# Patient Record
Sex: Male | Born: 1975 | Race: Black or African American | Hispanic: No | Marital: Single | Smoking: Current every day smoker
Health system: Southern US, Community
[De-identification: ages and names within clinical notes are randomized; demographics above are authoritative.]

## PROBLEM LIST (undated history)

## (undated) DIAGNOSIS — S8263XA Displaced fracture of lateral malleolus of unspecified fibula, initial encounter for closed fracture: Secondary | ICD-10-CM

## (undated) DIAGNOSIS — F1910 Other psychoactive substance abuse, uncomplicated: Secondary | ICD-10-CM

---
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
## 1998-05-14 ENCOUNTER — Emergency Department (HOSPITAL_COMMUNITY): Admission: EM | Admit: 1998-05-14 | Discharge: 1998-05-14 | Payer: Self-pay | Admitting: Emergency Medicine

## 1998-05-14 ENCOUNTER — Encounter: Payer: Self-pay | Admitting: Emergency Medicine

## 2000-01-31 ENCOUNTER — Emergency Department (HOSPITAL_COMMUNITY): Admission: EM | Admit: 2000-01-31 | Discharge: 2000-01-31 | Payer: Self-pay | Admitting: Emergency Medicine

## 2000-01-31 ENCOUNTER — Encounter: Payer: Self-pay | Admitting: Emergency Medicine

## 2004-08-26 ENCOUNTER — Emergency Department (HOSPITAL_COMMUNITY): Admission: EM | Admit: 2004-08-26 | Discharge: 2004-08-26 | Payer: Self-pay | Admitting: Emergency Medicine

## 2004-12-31 ENCOUNTER — Emergency Department (HOSPITAL_COMMUNITY): Admission: EM | Admit: 2004-12-31 | Discharge: 2004-12-31 | Payer: Self-pay | Admitting: Emergency Medicine

## 2006-08-26 ENCOUNTER — Emergency Department (HOSPITAL_COMMUNITY): Admission: EM | Admit: 2006-08-26 | Discharge: 2006-08-26 | Payer: Self-pay | Admitting: Emergency Medicine

## 2011-08-31 ENCOUNTER — Emergency Department (HOSPITAL_COMMUNITY): Payer: Self-pay

## 2011-08-31 ENCOUNTER — Emergency Department (HOSPITAL_COMMUNITY)
Admission: EM | Admit: 2011-08-31 | Discharge: 2011-08-31 | Disposition: A | Discharge status: Home / Self Care | Payer: Self-pay | Attending: Emergency Medicine | Admitting: Emergency Medicine

## 2011-08-31 ENCOUNTER — Encounter (HOSPITAL_COMMUNITY): Payer: Self-pay | Admitting: *Deleted

## 2011-08-31 DIAGNOSIS — F17200 Nicotine dependence, unspecified, uncomplicated: Secondary | ICD-10-CM | POA: Insufficient documentation

## 2011-08-31 DIAGNOSIS — M25519 Pain in unspecified shoulder: Secondary | ICD-10-CM | POA: Insufficient documentation

## 2011-08-31 DIAGNOSIS — M25512 Pain in left shoulder: Secondary | ICD-10-CM

## 2011-08-31 MED ORDER — DIAZEPAM 5 MG PO TABS: 5.0000 mg | ORAL_TABLET | Freq: Two times a day (BID) | ORAL | Status: AC | PRN

## 2011-08-31 MED ORDER — DIAZEPAM 5 MG PO TABS
5.0000 mg | ORAL_TABLET | Freq: Once | ORAL | Status: AC
  Administered 2011-08-31: 5 mg via ORAL
  Filled 2011-08-31: qty 1

## 2011-08-31 MED ORDER — HYDROCODONE-ACETAMINOPHEN 5-325 MG PO TABS: 1.0000 | ORAL_TABLET | Freq: Four times a day (QID) | ORAL | Status: AC | PRN

## 2011-08-31 MED ORDER — IBUPROFEN 400 MG PO TABS
800.0000 mg | ORAL_TABLET | Freq: Once | ORAL | Status: AC
  Administered 2011-08-31: 800 mg via ORAL
  Filled 2011-08-31 (×2): qty 1

## 2011-08-31 MED ORDER — IBUPROFEN 800 MG PO TABS: 800.0000 mg | ORAL_TABLET | Freq: Three times a day (TID) | ORAL | Status: AC

## 2011-08-31 NOTE — ED Provider Notes (Signed)
History    Scribed for [name], MD, the patient was seen in room TR02C/TR02C. This chart was scribed by Jared Morales.   CSN: [id number]  Arrival date & time 08/31/11  1119   First MD Initiated Contact with Patient 08/31/11 1329      Chief Complaint  Patient presents with  . Neck Pain  . Back Pain  . Shoulder Pain  . Hand Pain    (Consider location/radiation/quality/duration/timing/severity/associated sxs/prior treatment) HPI [name], MD entered patient's room at 2:14 PM   Jared Morales is a 36 y.o. male with history of chronic neck pain presents to the Emergency Department complaining of worsening of pain in constant neck and left shoulder pain that radiates to back and hands. Patient dislocated shoulder while playing football years ago.  Patient states he popped his shoulder back in and did not have xray following the event.   Symptoms are associated with numbness in 3 fingers (new) and minor headaches. Patient took Aleve this AM without relief.   Symptoms are not associated with chest pain, abdominal pain, incontinence, confusion, vomiting or fever.        History reviewed. No pertinent past medical history.  Past Surgical History  Procedure Date  . Knee surgery     No family history on file.  History  Substance Use Topics  . Smoking status: Current Everyday Smoker  . Smokeless tobacco: Not on file  . Alcohol Use: Yes      Review of Systems  All other systems reviewed and are negative.  Remaining review of systems negative except as noted in the HPI.    Allergies  Review of patient's allergies indicates no known allergies.  Home Medications   Current Outpatient Rx  Name Route Sig Dispense Refill  . ALEVE PO Oral Take 2 tablets by mouth once as needed. For pain    . DIAZEPAM 5 MG PO TABS Oral Take 1 tablet (5 mg total) by mouth every 12 (twelve) hours as needed (muscle spasm). 12 tablet 0  . HYDROCODONE-ACETAMINOPHEN 5-325 MG PO  TABS Oral Take 1 tablet by mouth every 6 (six) hours as needed for pain. 12 tablet 0  . IBUPROFEN 800 MG PO TABS Oral Take 1 tablet (800 mg total) by mouth 3 (three) times daily. 9 tablet 0    BP 122/75  Pulse 62  Temp 98.9 F (37.2 C) (Oral)  Resp 16  Ht 5\' 6"  (1.676 m)  Wt 150 lb (68.04 kg)  BMI 24.21 kg/m2  SpO2 100%  Physical Exam  Nursing note and vitals reviewed. Constitutional: He is oriented to person, place, and time. He appears well-developed. No distress.  HENT:  Head: Normocephalic and atraumatic.  Eyes: Conjunctivae and EOM are normal.  Cardiovascular: Normal rate and regular rhythm.   Pulmonary/Chest: Effort normal. No stridor. No respiratory distress.  Abdominal: He exhibits no distension.  Musculoskeletal: He exhibits tenderness. He exhibits no edema.       Superior trapezius pain, superior thoracic tenderness, diminished abduction, left AC joint tenderness   Neurological: He is alert and oriented to person, place, and time.  Skin: Skin is warm and dry.  Psychiatric: He has a normal mood and affect.    ED Course  Procedures (including critical care time)     COORDINATION OF CARE: 2:18 PM  Physical exam complete.  Will xray left shoulder.   2:30 PM  Ibuprofen and Valium ordered.   3:09 PM  Plan to discharge patient.  Patient agrees with  plan.     Orders Placed This Encounter  Procedures  . DG Shoulder Left  . Apply ice    LABS / RADIOLOGY:   Labs Reviewed - No data to display Dg Shoulder Left  08/31/2011  *RADIOLOGY REPORT*  Clinical Data: Left shoulder pain.  History of recent dislocation.  LEFT SHOULDER - 2+ VIEW  Comparison: None  Findings: The joint spaces are maintained.  No fracture or dislocation.  The [facility] joint is intact.  There is mild lateral downsloping of the acromion.  The left lung is clear.  IMPRESSION: No fracture or dislocation.  Original Report Authenticated By: P. Loralie Champagne, M.D.         MDM  This young M presents in no  distress w worsening of his chronic L UE pain, and dysesthesia.  Absent focal neuro findings, or e/o systemic pathology, with reassuring XR, demonstrating likely chronic findings, he was d/c w Ortho f/u.    MEDICATIONS GIVEN IN THE E.D. Scheduled Meds:    . diazepam  5 mg Oral Once  . ibuprofen  800 mg Oral Once   Continuous Infusions:      IMPRESSION: 1. Shoulder pain, left      NEW MEDICATIONS: New Prescriptions   DIAZEPAM (VALIUM) 5 MG TABLET    Take 1 tablet (5 mg total) by mouth every 12 (twelve) hours as needed (muscle spasm).   HYDROCODONE-ACETAMINOPHEN (NORCO/VICODIN) 5-325 MG PER TABLET    Take 1 tablet by mouth every 6 (six) hours as needed for pain.   IBUPROFEN (ADVIL,MOTRIN) 800 MG TABLET    Take 1 tablet (800 mg total) by mouth 3 (three) times daily.    I personally performed the services described in this documentation, which was scribed in my presence. The recorded information has been reviewed and considered.    [name], MD 09/01/11 [phone]

## 2011-08-31 NOTE — ED Notes (Signed)
Patient reports he has had severe neck pain that radiates into his left shoulder, neck and into his hand.  He reports over the past 2 weeks he has had onset of numbness in his hand and arm on the left side.  He has hx of dislocation to the left shoulder,  No other trauma

## 2013-12-04 DIAGNOSIS — S8263XA Displaced fracture of lateral malleolus of unspecified fibula, initial encounter for closed fracture: Secondary | ICD-10-CM

## 2013-12-04 HISTORY — DX: Displaced fracture of lateral malleolus of unspecified fibula, initial encounter for closed fracture: S82.63XA

## 2013-12-23 ENCOUNTER — Emergency Department (HOSPITAL_COMMUNITY)
Admission: EM | Admit: 2013-12-23 | Discharge: 2013-12-23 | Disposition: A | Discharge status: Home / Self Care | Payer: Self-pay | Attending: Emergency Medicine | Admitting: Emergency Medicine

## 2013-12-23 ENCOUNTER — Emergency Department (HOSPITAL_COMMUNITY): Payer: Self-pay

## 2013-12-23 ENCOUNTER — Encounter (HOSPITAL_COMMUNITY): Payer: Self-pay | Admitting: Emergency Medicine

## 2013-12-23 DIAGNOSIS — M25371 Other instability, right ankle: Secondary | ICD-10-CM

## 2013-12-23 DIAGNOSIS — Z791 Long term (current) use of non-steroidal anti-inflammatories (NSAID): Secondary | ICD-10-CM | POA: Insufficient documentation

## 2013-12-23 DIAGNOSIS — S99919A Unspecified injury of unspecified ankle, initial encounter: Secondary | ICD-10-CM

## 2013-12-23 DIAGNOSIS — S82891A Other fracture of right lower leg, initial encounter for closed fracture: Secondary | ICD-10-CM | POA: Insufficient documentation

## 2013-12-23 DIAGNOSIS — Y9389 Activity, other specified: Secondary | ICD-10-CM | POA: Insufficient documentation

## 2013-12-23 DIAGNOSIS — S0081XA Abrasion of other part of head, initial encounter: Secondary | ICD-10-CM | POA: Insufficient documentation

## 2013-12-23 DIAGNOSIS — T1490XA Injury, unspecified, initial encounter: Secondary | ICD-10-CM

## 2013-12-23 DIAGNOSIS — Y9289 Other specified places as the place of occurrence of the external cause: Secondary | ICD-10-CM | POA: Insufficient documentation

## 2013-12-23 DIAGNOSIS — Z720 Tobacco use: Secondary | ICD-10-CM | POA: Insufficient documentation

## 2013-12-23 DIAGNOSIS — Y998 Other external cause status: Secondary | ICD-10-CM | POA: Insufficient documentation

## 2013-12-23 DIAGNOSIS — S0012XA Contusion of left eyelid and periocular area, initial encounter: Secondary | ICD-10-CM

## 2013-12-23 MED ORDER — IBUPROFEN 800 MG PO TABS: 800.0000 mg | ORAL_TABLET | Freq: Three times a day (TID) | ORAL | Status: DC

## 2013-12-23 MED ORDER — HYDROCODONE-ACETAMINOPHEN 5-325 MG PO TABS
1.0000 | ORAL_TABLET | Freq: Once | ORAL | Status: AC
  Administered 2013-12-23: 1 via ORAL
  Filled 2013-12-23: qty 1

## 2013-12-23 MED ORDER — HYDROCODONE-ACETAMINOPHEN 5-325 MG PO TABS: 1.0000 | ORAL_TABLET | ORAL | Status: DC | PRN

## 2013-12-23 NOTE — Progress Notes (Signed)
Orthopedic Tech Progress Note Patient Details:  Jared SiaDerrick L Morales 03-17-1975 [id number]  Ortho Devices Type of Ortho Device: Ace wrap, Post (short leg) splint, Stirrup splint Ortho Device/Splint Location: rle Ortho Device/Splint Interventions: Application   Jared Morales 12/23/2013, 11:27 AM
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
## 2013-12-23 NOTE — Discharge Instructions (Signed)
Ankle Fracture °A fracture is a break in a bone. The ankle joint is made up of three bones. These include the lower (distal) sections of your lower leg bones, called the tibia and fibula, along with a bone in your foot, called the talus. Depending on how bad the break is and if more than one ankle joint bone is broken, a cast or splint is used to protect and keep your injured bone from moving while it heals. Sometimes, surgery is required to help the fracture heal properly.  °There are two general types of fractures: °· Stable fracture. This includes a single fracture line through one bone, with no injury to ankle ligaments. A fracture of the talus that does not have any displacement (movement of the bone on either side of the fracture line) is also stable. °· Unstable fracture. This includes more than one fracture line through one or more bones in the ankle joint. It also includes fractures that have displacement of the bone on either side of the fracture line. °CAUSES °· A direct blow to the ankle.   °· Quickly and severely twisting your ankle. °· Trauma, such as a car accident or falling from a significant height. °RISK FACTORS °You may be at a higher risk of ankle fracture if: °· You have certain medical conditions. °· You are involved in high-impact sports. °· You are involved in a high-impact car accident. °SIGNS AND SYMPTOMS  °· Tender and swollen ankle. °· Bruising around the injured ankle. °· Pain on movement of the ankle. °· Difficulty walking or putting weight on the ankle. °· A cold foot below the site of the ankle injury. This can occur if the blood vessels passing through your injured ankle were also damaged. °· Numbness in the foot below the site of the ankle injury. °DIAGNOSIS  °An ankle fracture is usually diagnosed with a physical exam and X-rays. A CT scan may also be required for complex fractures. °TREATMENT  °Stable fractures are treated with a cast or splint and using crutches to avoid putting  weight on your injured ankle. This is followed by an ankle strengthening program. Some patients require a special type of cast, depending on other medical problems they may have. Unstable fractures require surgery to ensure the bones heal properly. Your health care provider will tell you what type of fracture you have and the best treatment for your condition. °HOME CARE INSTRUCTIONS  °· Review correct crutch use with your health care provider and use your crutches as directed. Safe use of crutches is extremely important. Misuse of crutches can cause you to fall or cause injury to nerves in your hands or armpits. °· Do not put weight or pressure on the injured ankle until directed by your health care provider. °· To lessen the swelling, keep the injured leg elevated while sitting or lying down. °· Apply ice to the injured area: °¨ Put ice in a plastic bag. °¨ Place a towel between your cast and the bag. °¨ Leave the ice on for 20 minutes, 2-3 times a day. °· If you have a plaster or fiberglass cast: °¨ Do not try to scratch the skin under the cast with any objects. This can increase your risk of skin infection. °¨ Check the skin around the cast every day. You may put lotion on any red or sore areas. °¨ Keep your cast dry and clean. °· If you have a plaster splint: °¨ Wear the splint as directed. °¨ You may loosen the elastic   around the splint if your toes become numb, tingle, or turn cold or blue.  Do not put pressure on any part of your cast or splint; it may break. Rest your cast only on a pillow the first 24 hours until it is fully hardened.  Your cast or splint can be protected during bathing with a plastic bag sealed to your skin with medical tape. Do not lower the cast or splint into water.  Take medicines as directed by your health care provider. Only take over-the-counter or prescription medicines for pain, discomfort, or fever as directed by your health care provider.  Do not drive a vehicle until  your health care provider specifically tells you it is safe to do so.  If your health care provider has given you a follow-up appointment, it is very important to keep that appointment. Not keeping the appointment could result in a chronic or permanent injury, pain, and disability. If you have any problem keeping the appointment, call the facility for assistance. SEEK MEDICAL CARE IF: You develop increased swelling or discomfort. SEEK IMMEDIATE MEDICAL CARE IF:   Your cast gets damaged or breaks.  You have continued severe pain.  You develop new pain or swelling after the cast was put on.  Your skin or toenails below the injury turn blue or gray.  Your skin or toenails below the injury feel cold, numb, or have loss of sensitivity to touch.  There is a bad smell or pus draining from under the cast. MAKE SURE YOU:   Understand these instructions.  Will watch your condition.  Will get help right away if you are not doing well or get worse. Document Released: 01/18/2000 Document Revised: 01/25/2013 Document Reviewed: 08/19/2012 [facility] Patient Information 2015 [place], [place]. This information is not intended to replace advice given to you by your health care provider. Make sure you discuss any questions you have with your health care provider.   Emergency Department Resource Guide 1) Find a Doctor and Pay Out of Pocket Although you won't have to find out who is covered by your insurance plan, it is a good idea to ask around and get recommendations. You will then need to call the office and see if the doctor you have chosen will accept you as a new patient and what types of options they offer for patients who are self-pay. Some doctors offer discounts or will set up payment plans for their patients who do not have insurance, but you will need to ask so you aren't surprised when you get to your appointment.  2) Contact Your Local Health Department Not all health departments have doctors  that can see patients for sick visits, but many do, so it is worth a call to see if yours does. If you don't know where your local health department is, you can check in your phone book. The CDC also has a tool to help you locate your state's health department, and many state websites also have listings of all of their local health departments.  3) Find a Walk-in Clinic If your illness is not likely to be very severe or complicated, you may want to try a walk in clinic. These are popping up all over the country in pharmacies, drugstores, and shopping centers. They're usually staffed by nurse practitioners or physician assistants that have been trained to treat common illnesses and complaints. They're usually fairly quick and inexpensive. However, if you have serious medical issues or chronic medical problems, these are probably not  your best option.  No Primary Care Doctor: - Call Health Connect at  [phone] - they can help you locate a primary care doctor that  accepts your insurance, provides certain services, etc. - Physician Referral Service- [phone]  Chronic Pain Problems: Organization         Address  Phone   Notes  [facility]  [phone] Patients need to be referred by their primary care doctor.   Medication Assistance: Organization         Address  Phone   Notes  [facility] Medication [facility] [address] [place] [place], [place] [address] [phone] --Must be a resident of [facility] -- Must have NO insurance coverage whatsoever (no Medicaid/ Medicare, etc.) -- The pt. MUST have a primary care doctor that directs their care regularly and follows them in the community   [facility]  [phone]   [employer]  [phone]    Agencies that provide inexpensive medical care: Organization         Address  Phone   Notes  [facility]  [phone]   [facility]    [phone]   [facility] [address] [place], [place] [address] [phone]   [facility] [address], [place] [phone]   Planned Parenthood    [phone]   [facility]    [phone]   [facility]  [address], [place] Phone:  [phone], Fax:  [phone] Hours of Operation:  9 am - 6 pm, M-F.  Also accepts Medicaid/Medicare and self-pay.  [facility]  [address], [place] Phone: [phone], Fax: [phone]. Hours of Operation:  8:30 am - 5:30 pm, M-F.  Also accepts Medicaid and self-pay.  [facility] [place] [address] [place] Phone: [phone]   [facility] [address] Natasha Bence [place], [place] [phone], Ext. [phone] Mondays & Thursdays: 7-9 AM.  First 15 patients are seen on a first come, first serve basis.    Medicaid-accepting [facility] Providers:  Organization         Address  Phone   Notes  [facility] [address], [place] [phone] Also accepts self-pay patients.  [facility] [address] Laurell Josephs [place], [place]  [phone]   [facility] [address], [place] [phone]   [facility] [address] [place] [phone]   Renaye Rakers [address], [place]   [phone] Only accepts [place] Access [place] patients after they have their name applied to their card.   Self-Pay (no insurance) in [facility]:  Organization         Address  Phone   Notes  Sickle Cell Patients, [facility] [address] [place], [place] [phone]   [facility] [address] [place], [place] [phone]   [facility] [place]  [address], [place] [phone]   [facility]/Dr.  Osei-Bonsu  [address], [place] or [address], [place] [phone] Phone number for both [place] and [place] locations is the same.  [facility] [address]  Dr, [place] [phone]) [phone]   [facility] [address] [place] [address], [place] or [address] [phone]) [phone]   [facility] [address], [place] [phone](336) [phone], phone; [phone], fax Sees patients 1st and 3rd Saturday of every month.  Must not qualify for public or private insurance (i.e. Medicaid, Medicare, [place] Health Choice, Veterans' Benefits)  Household income should be no more than 200% of the poverty level The clinic cannot treat you if you are pregnant or think you are pregnant  Sexually transmitted diseases are not treated at the clinic.    Dental Care: Organization         Address  Phone  Notes  [facility] [address], [place] [phone] Accepts children up to age 40 who are enrolled in [place] or [place] Health Choice; pregnant women with a Medicaid card; and children who have applied for Medicaid or [place] Health Choice, but were declined, whose parents can pay a reduced fee at time of service.  [facility] [place]  [address], [place] [phone]) [phone] Accepts children up to age 40 who are enrolled in [place] or [place] Health Choice; pregnant women with a Medicaid card; and children who have applied for Medicaid or [place] Health Choice, but were declined, whose parents can pay a reduced fee at time of service.  [facility]  [address], [place] [phone]) [phone] Patients are seen by appointment only. Walk-ins are not accepted. [facility] will see patients 40 years of age and older. Monday - Tuesday (8am-5pm) Most Wednesdays (8:30-5pm) $30 per visit, cash only  [facility]  [address], [facility] [place] [phone]) [phone] Patients are seen by appointment only. Walk-ins are not accepted. [facility] will see patients 40 years of age and older. One Wednesday Evening (Monthly: Volunteer Based).  $30 per visit, cash only  [facility]  [phone] for adults; Children under age 524, call Graduate Pediatric Dentistry at [phone]) [phone]. Children aged 694-14, please call [phone] to request a pediatric application.  Dental services are provided in all areas of dental care including fillings, crowns and bridges, complete and partial dentures, implants, gum treatment, root canals, and extractions. Preventive care is also provided. Treatment is provided to both adults and children. Patients are selected via a lottery and there is often a waiting list.   [facility] [address], [place]  [phone] [email or web address]   [facility] [address] [place] [address], [place], [place] [phone], Ext. [phone] Second and Fourth Thursday of each month, opens at 6:30 AM; Clinic ends at 9 AM.  Patients are seen on a first-come first-served basis, and a limited number are seen during each clinic.   [facility]  [address], [place], [place] [phone]   Eligibility Requirements You must have lived in [place], [place], or [place] counties for at least the last three months.   You cannot be eligible for state or federal sponsored [place] Cityhealthcare insurance, including CIGNAVeterans Administration, [place]icaid, or [employer] EntertainmentMedicare.   You generally cannot be eligible for healthcare insurance through your employer.    How to apply: Eligibility screenings are held every Tuesday and Wednesday afternoon from 1:00 pm until 4:00 pm. You do not need an appointment for the interview!  [facility] [address],  [place], [place] [phone]   [facility] Health Department  [phone]   [facility]  Health Department  [phone]   [facility] Health Department  [phone]    Behavioral Health Resources in the Community: Intensive Outpatient Programs Organization         Address  Phone  Notes  [facility] [address] [place], [place] [phone]   [facility] [address], [place], [place] [phone]   [facility] [address], [place], [place]  [phone]   [facility] Mental Health [address]  [place], [place] [phone] or [phone]   Substance Abuse Resources Organization         Address  Phone  Notes  [facility] Drug Services  [phone]   [facility]  [phone]   [facility] [place]  [phone]   Floydene Flock  [phone]   Residential & Outpatient Substance Abuse Program  [phone]   Psychological Services Organization         Address  Phone  Notes  [facility] Behavior[facility] Health  [phone]   [facility] Services  [phone]   [facility] Mental Health [address], [place] [phone] or [phone]    Mobile Crisis Teams Organization         Address  Phone  Notes  [facility], Mobile Crisis Care Unit  [phone]   [facility]  [address] [place], [place] [phone]   Doristine Locks [address] [place] [phone]    Self-Help/Support Groups Organization         Address  Phone             Notes  [facility] [place] - variety of support groups  [phone]- I7437963 Call for more information  Narcotics Anonymous (NA), [facility] [address], [employer]   2 meetings at this location   Statistician         Address  Phone  Notes  [facility] [address],    [place]  [phone]   [facility]  [address] [place] [address], [place], [place] [phone]   [facility]  Facility [address] [place], [place] [phone] Admissions: 8am-3pm M-F  [facility] [address]    [place], [place] [phone]   [facility] [address] [place], [place], [place] [phone]   [facility] [address],  [place], [place] [phone]   [facility] - Intensive Outpatient [address] Laurell Josephs [address], [place], [place] [phone]   [facility] (Addiction Recovery [facility] [address]  [place], [place] [phone] or [phone]   [facility] ([facility]) [address], [place], [place] [phone] Accepts Medicaid  [facility] [address]  [place] [phone] Substance Abuse/Addiction Treatment   [facility] Organization         Address  Phone  Notes  [facility]  [phone]   Angie Fava, PhD [address] Ervin Knack [place], [place]   [phone] or [phone]   [facility] Behavioral   [address] [place], [place] [phone]   [facility] [address], [place], [place] [phone] Insurance/Medicaid/sponsorship through [employer] and Families [address]  [place], [place] [phone] [place], [place] [phone]    Dr. Adele Schilder  [phone]   [facility] Dept. 1) [address] [place] 2) [place] 3)  [address], [place] [phone]  [phone]   [place] [phone] or [phone] (After Hours)

## 2013-12-26 ENCOUNTER — Encounter (HOSPITAL_BASED_OUTPATIENT_CLINIC_OR_DEPARTMENT_OTHER): Payer: Self-pay | Admitting: *Deleted

## 2013-12-27 ENCOUNTER — Encounter (HOSPITAL_BASED_OUTPATIENT_CLINIC_OR_DEPARTMENT_OTHER)
Admission: RE | Disposition: A | Discharge status: Home / Self Care | Payer: Self-pay | Source: Ambulatory Visit | Attending: Orthopedic Surgery

## 2013-12-27 ENCOUNTER — Ambulatory Visit (HOSPITAL_BASED_OUTPATIENT_CLINIC_OR_DEPARTMENT_OTHER): Payer: Self-pay | Admitting: Certified Registered"

## 2013-12-27 ENCOUNTER — Encounter (HOSPITAL_BASED_OUTPATIENT_CLINIC_OR_DEPARTMENT_OTHER): Payer: Self-pay

## 2013-12-27 ENCOUNTER — Ambulatory Visit (HOSPITAL_BASED_OUTPATIENT_CLINIC_OR_DEPARTMENT_OTHER)
Admission: RE | Admit: 2013-12-27 | Discharge: 2013-12-27 | Disposition: A | Discharge status: Home / Self Care | Payer: Self-pay | Source: Ambulatory Visit | Attending: Orthopedic Surgery | Admitting: Orthopedic Surgery

## 2013-12-27 DIAGNOSIS — Z8781 Personal history of (healed) traumatic fracture: Secondary | ICD-10-CM | POA: Insufficient documentation

## 2013-12-27 DIAGNOSIS — F17200 Nicotine dependence, unspecified, uncomplicated: Secondary | ICD-10-CM | POA: Insufficient documentation

## 2013-12-27 DIAGNOSIS — Y999 Unspecified external cause status: Secondary | ICD-10-CM | POA: Insufficient documentation

## 2013-12-27 DIAGNOSIS — Y929 Unspecified place or not applicable: Secondary | ICD-10-CM | POA: Insufficient documentation

## 2013-12-27 DIAGNOSIS — Y939 Activity, unspecified: Secondary | ICD-10-CM | POA: Insufficient documentation

## 2013-12-27 DIAGNOSIS — Z791 Long term (current) use of non-steroidal anti-inflammatories (NSAID): Secondary | ICD-10-CM | POA: Insufficient documentation

## 2013-12-27 DIAGNOSIS — S8261XA Displaced fracture of lateral malleolus of right fibula, initial encounter for closed fracture: Secondary | ICD-10-CM | POA: Insufficient documentation

## 2013-12-27 DIAGNOSIS — X58XXXA Exposure to other specified factors, initial encounter: Secondary | ICD-10-CM | POA: Insufficient documentation

## 2013-12-27 HISTORY — PX: ORIF ANKLE FRACTURE: SHX5408

## 2013-12-27 HISTORY — DX: Displaced fracture of lateral malleolus of unspecified fibula, initial encounter for closed fracture: S82.63XA

## 2013-12-27 LAB — POCT HEMOGLOBIN-HEMACUE: Hemoglobin: 14.9 g/dL (ref 13.0–17.0)

## 2013-12-27 SURGERY — OPEN REDUCTION INTERNAL FIXATION (ORIF) ANKLE FRACTURE
Anesthesia: Regional | Site: Ankle | Laterality: Right

## 2013-12-27 MED ORDER — ACETAMINOPHEN 500 MG PO TABS
ORAL_TABLET | ORAL | Status: AC
  Filled 2013-12-27: qty 2

## 2013-12-27 MED ORDER — ROPIVACAINE HCL 5 MG/ML IJ SOLN
INTRAMUSCULAR | Status: DC | PRN
  Administered 2013-12-27: 30 mL via PERINEURAL

## 2013-12-27 MED ORDER — OXYCODONE HCL 5 MG PO TABS: 5.0000 mg | ORAL_TABLET | Freq: Once | ORAL | Status: DC | PRN

## 2013-12-27 MED ORDER — HYDROMORPHONE HCL 1 MG/ML IJ SOLN: 0.2500 mg | INTRAMUSCULAR | Status: DC | PRN

## 2013-12-27 MED ORDER — KETOROLAC TROMETHAMINE 30 MG/ML IJ SOLN: 15.0000 mg | Freq: Once | INTRAMUSCULAR | Status: DC | PRN

## 2013-12-27 MED ORDER — LIDOCAINE HCL (CARDIAC) 20 MG/ML IV SOLN
INTRAVENOUS | Status: DC | PRN
  Administered 2013-12-27: 80 mg via INTRAVENOUS

## 2013-12-27 MED ORDER — ONDANSETRON HCL 4 MG PO TABS: 4.0000 mg | ORAL_TABLET | Freq: Three times a day (TID) | ORAL | Status: AC | PRN

## 2013-12-27 MED ORDER — OXYCODONE-ACETAMINOPHEN 5-325 MG PO TABS
2.0000 | ORAL_TABLET | ORAL | Status: AC | PRN
Start: 2013-12-27 — End: ?

## 2013-12-27 MED ORDER — FENTANYL CITRATE 0.05 MG/ML IJ SOLN
50.0000 ug | INTRAMUSCULAR | Status: DC | PRN
  Administered 2013-12-27: 100 ug via INTRAVENOUS

## 2013-12-27 MED ORDER — MIDAZOLAM HCL 2 MG/2ML IJ SOLN
1.0000 mg | INTRAMUSCULAR | Status: DC | PRN
  Administered 2013-12-27: 2 mg via INTRAVENOUS

## 2013-12-27 MED ORDER — ACETAMINOPHEN 500 MG PO TABS
1000.0000 mg | ORAL_TABLET | Freq: Once | ORAL | Status: AC
  Administered 2013-12-27: 1000 mg via ORAL

## 2013-12-27 MED ORDER — FENTANYL CITRATE 0.05 MG/ML IJ SOLN
INTRAMUSCULAR | Status: AC
  Filled 2013-12-27: qty 2

## 2013-12-27 MED ORDER — CEFAZOLIN SODIUM-DEXTROSE 2-3 GM-% IV SOLR
2.0000 g | INTRAVENOUS | Status: AC
  Administered 2013-12-27: 2 g via INTRAVENOUS

## 2013-12-27 MED ORDER — MIDAZOLAM HCL 2 MG/2ML IJ SOLN
INTRAMUSCULAR | Status: AC
  Filled 2013-12-27: qty 2

## 2013-12-27 MED ORDER — CEFAZOLIN SODIUM-DEXTROSE 2-3 GM-% IV SOLR
INTRAVENOUS | Status: AC
  Filled 2013-12-27: qty 50

## 2013-12-27 MED ORDER — ASPIRIN 81 MG PO TABS: 81.0000 mg | ORAL_TABLET | Freq: Every day | ORAL | Status: AC

## 2013-12-27 MED ORDER — GLYCOPYRROLATE 0.2 MG/ML IJ SOLN
INTRAMUSCULAR | Status: DC | PRN
  Administered 2013-12-27: 0.2 mg via INTRAVENOUS

## 2013-12-27 MED ORDER — LACTATED RINGERS IV SOLN
INTRAVENOUS | Status: DC
  Administered 2013-12-27 (×2): via INTRAVENOUS

## 2013-12-27 MED ORDER — DEXTROSE-NACL 5-0.45 % IV SOLN: 100.0000 mL/h | INTRAVENOUS | Status: DC

## 2013-12-27 MED ORDER — FENTANYL CITRATE 0.05 MG/ML IJ SOLN
INTRAMUSCULAR | Status: AC
  Filled 2013-12-27: qty 6

## 2013-12-27 MED ORDER — MIDAZOLAM HCL 2 MG/ML PO SYRP: 12.0000 mg | ORAL_SOLUTION | Freq: Once | ORAL | Status: DC | PRN

## 2013-12-27 MED ORDER — DEXAMETHASONE SODIUM PHOSPHATE 10 MG/ML IJ SOLN
INTRAMUSCULAR | Status: DC | PRN
  Administered 2013-12-27: 10 mg via INTRAVENOUS

## 2013-12-27 MED ORDER — PROPOFOL 10 MG/ML IV EMUL
INTRAVENOUS | Status: AC
  Filled 2013-12-27: qty 50

## 2013-12-27 MED ORDER — ONDANSETRON HCL 4 MG/2ML IJ SOLN
INTRAMUSCULAR | Status: DC | PRN
  Administered 2013-12-27: 4 mg via INTRAVENOUS

## 2013-12-27 MED ORDER — PROPOFOL 10 MG/ML IV BOLUS
INTRAVENOUS | Status: DC | PRN
  Administered 2013-12-27: 200 mg via INTRAVENOUS

## 2013-12-27 MED ORDER — OXYCODONE HCL 5 MG/5ML PO SOLN: 5.0000 mg | Freq: Once | ORAL | Status: DC | PRN

## 2013-12-27 MED ORDER — DOCUSATE SODIUM 100 MG PO CAPS: 100.0000 mg | ORAL_CAPSULE | Freq: Two times a day (BID) | ORAL | Status: AC

## 2013-12-27 SURGICAL SUPPLY — 72 items
BANDAGE ELASTIC 4 VELCRO ST LF (GAUZE/BANDAGES/DRESSINGS) ×3 IMPLANT
BANDAGE ELASTIC 6 VELCRO ST LF (GAUZE/BANDAGES/DRESSINGS) ×3 IMPLANT
BANDAGE ESMARK 6X9 LF (GAUZE/BANDAGES/DRESSINGS) ×1 IMPLANT
BIT DRILL 2.5X125 (BIT) ×2 IMPLANT
BIT DRILL 3.5X125 (BIT) IMPLANT
BLADE SURG 15 STRL LF DISP TIS (BLADE) ×2 IMPLANT
BLADE SURG 15 STRL SS (BLADE) ×6
BNDG CMPR 9X6 STRL LF SNTH (GAUZE/BANDAGES/DRESSINGS) ×1
BNDG COHESIVE 4X5 TAN STRL (GAUZE/BANDAGES/DRESSINGS) IMPLANT
BNDG ESMARK 6X9 LF (GAUZE/BANDAGES/DRESSINGS) ×3
CHLORAPREP W/TINT 26ML (MISCELLANEOUS) ×3 IMPLANT
CLOSURE STERI-STRIP 1/2X4 (GAUZE/BANDAGES/DRESSINGS) ×2
CLSR STERI-STRIP ANTIMIC 1/2X4 (GAUZE/BANDAGES/DRESSINGS) ×3 IMPLANT
COVER BACK TABLE 60X90IN (DRAPES) ×3 IMPLANT
COVER MAYO STAND STRL (DRAPES) ×3 IMPLANT
CUFF TOURNIQUET SINGLE 24IN (TOURNIQUET CUFF) IMPLANT
CUFF TOURNIQUET SINGLE 34IN LL (TOURNIQUET CUFF) ×2 IMPLANT
DECANTER SPIKE VIAL GLASS SM (MISCELLANEOUS) IMPLANT
DRAPE C-ARM 42X72 X-RAY (DRAPES) IMPLANT
DRAPE C-ARMOR (DRAPES) IMPLANT
DRAPE EXTREMITY T 121X128X90 (DRAPE) ×3 IMPLANT
DRAPE OEC MINIVIEW 54X84 (DRAPES) ×3 IMPLANT
DRAPE U 20/CS (DRAPES) ×3 IMPLANT
DRAPE U-SHAPE 47X51 STRL (DRAPES) ×3 IMPLANT
DRILL BIT 3.5X125 (BIT) ×3
DRSG EMULSION OIL 3X3 NADH (GAUZE/BANDAGES/DRESSINGS) ×3 IMPLANT
ELECT REM PT RETURN 9FT ADLT (ELECTROSURGICAL) ×3
ELECTRODE REM PT RTRN 9FT ADLT (ELECTROSURGICAL) ×1 IMPLANT
GAUZE SPONGE 4X4 12PLY STRL (GAUZE/BANDAGES/DRESSINGS) ×3 IMPLANT
GLOVE BIO SURGEON STRL SZ7.5 (GLOVE) ×5 IMPLANT
GLOVE BIO SURGEON STRL SZ8 (GLOVE) ×2 IMPLANT
GLOVE BIOGEL PI IND STRL 7.0 (GLOVE) IMPLANT
GLOVE BIOGEL PI IND STRL 8 (GLOVE) ×1 IMPLANT
GLOVE BIOGEL PI INDICATOR 7.0 (GLOVE) ×2
GLOVE BIOGEL PI INDICATOR 8 (GLOVE) ×2
GLOVE ECLIPSE 6.5 STRL STRAW (GLOVE) ×2 IMPLANT
GLOVE EXAM NITRILE MD LF STRL (GLOVE) ×2 IMPLANT
GOWN STRL REUS W/ TWL LRG LVL3 (GOWN DISPOSABLE) ×4 IMPLANT
GOWN STRL REUS W/ TWL XL LVL3 (GOWN DISPOSABLE) ×1 IMPLANT
GOWN STRL REUS W/TWL LRG LVL3 (GOWN DISPOSABLE) ×12
GOWN STRL REUS W/TWL XL LVL3 (GOWN DISPOSABLE) ×3
NEEDLE HYPO 22GX1.5 SAFETY (NEEDLE) IMPLANT
NS IRRIG 1000ML POUR BTL (IV SOLUTION) ×3 IMPLANT
PACK BASIN DAY SURGERY FS (CUSTOM PROCEDURE TRAY) ×3 IMPLANT
PAD CAST 4YDX4 CTTN HI CHSV (CAST SUPPLIES) ×1 IMPLANT
PADDING CAST COTTON 4X4 STRL (CAST SUPPLIES) ×3
PADDING CAST COTTON 6X4 STRL (CAST SUPPLIES) ×3 IMPLANT
PENCIL BUTTON HOLSTER BLD 10FT (ELECTRODE) ×3 IMPLANT
PLATE TUBUAL 1/3 6H (Plate) ×2 IMPLANT
SCREW CANC FT 16X4X2.5XHEX (Screw) IMPLANT
SCREW CANCELLOUS 4.0X14 (Screw) ×2 IMPLANT
SCREW CANCELLOUS 4.0X16MM (Screw) ×3 IMPLANT
SCREW CORTEX ST MATTA 3.5X12MM (Screw) ×4 IMPLANT
SCREW CORTEX ST MATTA 3.5X16MM (Screw) ×2 IMPLANT
SCREW CORTEX ST MATTA 3.5X20 (Screw) ×2 IMPLANT
SLEEVE SCD COMPRESS KNEE MED (MISCELLANEOUS) IMPLANT
SPLINT FAST PLASTER 5X30 (CAST SUPPLIES) ×20
SPLINT PLASTER CAST FAST 5X30 (CAST SUPPLIES) IMPLANT
SPONGE LAP 4X18 X RAY DECT (DISPOSABLE) ×3 IMPLANT
SUCTION FRAZIER TIP 10 FR DISP (SUCTIONS) IMPLANT
SUT MON AB 2-0 CT1 36 (SUTURE) ×3 IMPLANT
SUT MON AB 4-0 PC3 18 (SUTURE) ×3 IMPLANT
SUT VIC AB 0 SH 27 (SUTURE) IMPLANT
SUT VIC AB 2-0 SH 27 (SUTURE) ×3
SUT VIC AB 2-0 SH 27XBRD (SUTURE) IMPLANT
SYR 20CC LL (SYRINGE) ×3 IMPLANT
SYR BULB 3OZ (MISCELLANEOUS) ×3 IMPLANT
TOWEL OR 17X24 6PK STRL BLUE (TOWEL DISPOSABLE) ×6 IMPLANT
TOWEL OR NON WOVEN STRL DISP B (DISPOSABLE) ×3 IMPLANT
TUBE CONNECTING 20'X1/4 (TUBING) ×1
TUBE CONNECTING 20X1/4 (TUBING) ×2 IMPLANT
UNDERPAD 30X30 INCONTINENT (UNDERPADS AND DIAPERS) ×3 IMPLANT

## 2013-12-27 NOTE — Anesthesia Postprocedure Evaluation (Signed)
  Anesthesia Post-op Note  Patient: Jared Morales  Procedure(s) Performed: Procedure(s): OPEN REDUCTION INTERNAL FIXATION (ORIF) RIGHT LATERAL MALLEOLUS  (Right)  Patient Location: PACU  Anesthesia Type:General and Regional  Level of Consciousness: awake  Airway and Oxygen Therapy: Patient Spontanous Breathing  Post-op Pain: none  Post-op Assessment: Post-op Vital signs reviewed, Patient's Cardiovascular Status Stable, Respiratory Function Stable, Patent Airway, No signs of Nausea or vomiting and Pain level controlled  Post-op Vital Signs: Reviewed and stable  Last Vitals:  Filed Vitals:   12/27/13 1051  BP: 130/75  Pulse: 61  Temp: 36.5 C  Resp: 16    Complications: No apparent anesthesia complications

## 2013-12-27 NOTE — Discharge Instructions (Signed)
Elevate your Right leg  Do not put any weight on your right leg   Post Anesthesia Home Care Instructions  Activity: Get plenty of rest for the remainder of the day. A responsible adult should stay with you for 24 hours following the procedure.  For the next 24 hours, DO NOT: -Drive a car -Advertising copywriterperate machinery -Drink alcoholic beverages -Take any medication unless instructed by your physician -Make any legal decisions or sign important papers.  Meals: Start with liquid foods such as gelatin or soup. Progress to regular foods as tolerated. Avoid greasy, spicy, heavy foods. If nausea and/or vomiting occur, drink only clear liquids until the nausea and/or vomiting subsides. Call your physician if vomiting continues.  Special Instructions/Symptoms: Your throat may feel dry or sore from the anesthesia or the breathing tube placed in your throat during surgery. If this causes discomfort, gargle with warm salt water. The discomfort should disappear within 24 hours.   Regional Anesthesia Blocks  1. Numbness or the inability to move the "blocked" extremity may last from 3-48 hours after placement. The length of time depends on the medication injected and your individual response to the medication. If the numbness is not going away after 48 hours, call your surgeon.  2. The extremity that is blocked will need to be protected until the numbness is gone and the  Strength has returned. Because you cannot feel it, you will need to take extra care to avoid injury. Because it may be weak, you may have difficulty moving it or using it. You may not know what position it is in without looking at it while the block is in effect.  3. For blocks in the legs and feet, returning to weight bearing and walking needs to be done carefully. You will need to wait until the numbness is entirely gone and the strength has returned. You should be able to move your leg and foot normally before you try and bear weight or walk.  You will need someone to be with you when you first try to ensure you do not fall and possibly risk injury.  4. Bruising and tenderness at the needle site are common side effects and will resolve in a few days.  5. Persistent numbness or new problems with movement should be communicated to the surgeon or the [facility] [phone])/ [facility] [phone]).

## 2013-12-27 NOTE — Transfer of Care (Signed)
Immediate Anesthesia Transfer of Care Note  Patient: Jared Morales  Procedure(s) Performed: Procedure(s): OPEN REDUCTION INTERNAL FIXATION (ORIF) RIGHT LATERAL MALLEOLUS  (Right)  Patient Location: PACU  Anesthesia Type:GA combined with regional for post-op pain  Level of Consciousness: awake, alert  and oriented  Airway & Oxygen Therapy: Patient Spontanous Breathing and Patient connected to face mask oxygen  Post-op Assessment: Report given to PACU RN, Post -op Vital signs reviewed and stable and Patient moving all extremities  Post vital signs: Reviewed and stable  Complications: No apparent anesthesia complications

## 2013-12-27 NOTE — Anesthesia Procedure Notes (Addendum)
Anesthesia Regional Block:  Popliteal block  Pre-Anesthetic Checklist: ,, timeout performed, Correct Patient, Correct Site, Correct Laterality, Correct Procedure, Correct Position, site marked, Risks and benefits discussed,  Surgical consent,  Pre-op evaluation,  At surgeon's request and post-op pain management  Laterality: Lower and Right  Prep: chloraprep       Needles:  Injection technique: Single-shot  Needle Type: Echogenic Stimulator Needle          Additional Needles:  Procedures: ultrasound guided (picture in chart) and nerve stimulator Popliteal block  Nerve Stimulator or Paresthesia:  Response: plantarflexion, 0.5 mA,   Additional Responses:   Narrative:  Injection made incrementally with aspirations every 5 mL.  Performed by: Personally   Additional Notes: H+P and labs reviewed, risks and benefits discussed with patient, procedure tolerated well without complications   Procedure Name: LMA Insertion Date/Time: 12/27/2013 9:02 AM Performed by: Curly ShoresRAFT, Annessa Satre W Pre-anesthesia Checklist: Patient identified, Emergency Drugs available, Suction available and Patient being monitored Patient Re-evaluated:Patient Re-evaluated prior to inductionOxygen Delivery Method: Circle System Utilized Preoxygenation: Pre-oxygenation with 100% oxygen Intubation Type: IV induction Ventilation: Mask ventilation without difficulty LMA: LMA inserted LMA Size: 5.0 Number of attempts: 1 Airway Equipment and Method: bite block Placement Confirmation: positive ETCO2 Tube secured with: Tape Dental Injury: Teeth and Oropharynx as per pre-operative assessment

## 2013-12-27 NOTE — Op Note (Signed)
12/27/2013  9:33 AM  PATIENT:  Jared Morales    PRE-OPERATIVE DIAGNOSIS:  DISPLACED FRACTURE OF LATERAL MALLEOLUS OF RIGHT FIBULA   POST-OPERATIVE DIAGNOSIS:  Same  PROCEDURE:  OPEN REDUCTION INTERNAL FIXATION (ORIF) RIGHT LATERAL MALLEOLUS   SURGEON:  Quisha Mabie, D, MD  ASSISTANT: Janace LittenBrandon Parry, OPA, He was necessary for efficiency and safety of the case.   ANESTHESIA:   gen plus block  PREOPERATIVE INDICATIONS:  Jared Morales is a  38 y.o. male with a diagnosis of DISPLACED FRACTURE OF LATERAL MALLEOLUS OF RIGHT FIBULA  who failed conservative measures and elected for surgical management.    The risks benefits and alternatives were discussed with the patient preoperatively including but not limited to the risks of infection, bleeding, nerve injury, cardiopulmonary complications, the need for revision surgery, among others, and the patient was willing to proceed.  OPERATIVE IMPLANTS: 1/3 tubular plate and a lag screw  OPERATIVE FINDINGS: Unstable ankle fracture. Stable syndesmosis post op  BLOOD LOSS: min  COMPLICATIONS: none  TOURNIQUET TIME: 40min  OPERATIVE PROCEDURE:  Patient was identified in the preoperative holding area and site was marked by me He was transported to the operating theater and placed on the table in supine position taking care to pad all bony prominences. After a preincinduction time out anesthesia was induced. The right lower extremity was prepped and draped in normal sterile fashion and a pre-incision timeout was performed. Jared Morales received ancef for preoperative antibiotics.   I made a lateral incision of roughly 7 cm dissection was carried down sharply to the distal fibula and then spreading dissection was used proximally to protect the superficial peroneal nerve. I sharply incised the periosteum and took care to protect the peroneal tendons. I then debrided the fracture site and performed a reduction maneuver which was held in  place with a clamp.   I placed a lag screw across the fracture  I then selected a 6-hole one third tubular plate and placed in a neutralization fashion care was taken distally so as not to penetrate the joint with the cancellus screws.  I then stressed the syndesmosis and it was stable  The wound was then thoroughly irrigated and closed using a 0 Vicryl and absorbable Monocryl sutures. He was placed in a short leg splint.   POST OPERATIVE PLAN: Non-weightbearing. DVT prophylaxis will consist of mobility and a ASA 81 daily  This note was generated using a template and dragon dictation system. In light of that, I have reviewed the note and all aspects of it are applicable to this case. Any dictation errors are due to the computerized dictation system.

## 2013-12-27 NOTE — H&P (Signed)
      ORTHOPAEDIC CONSULTATION  REQUESTING PHYSICIAN: Renette Butters, MD  Chief Complaint: R ankle fracture  HPI: VIREN LEBEAU is a 38 y.o. male who complains of  Mechanical fall  Past Medical History  Diagnosis Date  . Fractured lateral malleolus 12/2013    right   Past Surgical History  Procedure Laterality Date  . Knee arthroscopy Left 1985   History   Social History  . Marital Status: Single    Spouse Name: N/A    Number of Children: N/A  . Years of Education: N/A   Social History Main Topics  . Smoking status: Current Every Day Smoker -- 0.50 packs/day for 15 years    Types: Cigarettes  . Smokeless tobacco: Never Used  . Alcohol Use: No  . Drug Use: No  . Sexual Activity: None   Other Topics Concern  . None   Social History Narrative   History reviewed. No pertinent family history. No Known Allergies Prior to Admission medications   Medication Sig Start Date End Date Taking? Authorizing Provider  HYDROcodone-acetaminophen (NORCO/VICODIN) 5-325 MG per tablet Take 1 tablet by mouth every 4 (four) hours as needed for moderate pain or severe pain. 12/23/13  Yes Courtney A Forcucci, PA-C  ibuprofen (ADVIL,MOTRIN) 800 MG tablet Take 1 tablet (800 mg total) by mouth 3 (three) times daily. 12/23/13  Yes Courtney A Forcucci, PA-C   No results found.  Positive ROS: All other systems have been reviewed and were otherwise negative with the exception of those mentioned in the HPI and as above.  Labs cbc No results for input(s): WBC, HGB, HCT, PLT in the last 72 hours.  Labs inflam No results for input(s): CRP in the last 72 hours.  Invalid input(s): ESR  Labs coag No results for input(s): INR, PTT in the last 72 hours.  Invalid input(s): PT  No results for input(s): NA, K, CL, CO2, GLUCOSE, BUN, CREATININE, CALCIUM in the last 72 hours.  Physical Exam: There were no vitals filed for this visit. General: Alert, no acute distress Cardiovascular: No  pedal edema Respiratory: No cyanosis, no use of accessory musculature GI: No organomegaly, abdomen is soft and non-tender Skin: No lesions in the area of chief complaint other than those listed below in MSK exam.  Neurologic: Sensation intact distally Psychiatric: Patient is competent for consent with normal mood and affect Lymphatic: No axillary or cervical lymphadenopathy  MUSCULOSKELETAL:  RLE: pain at the lat mal, mod swelliing, no blister, NVI Other extremities are atraumatic with painless ROM and NVI.  Assessment: R lat mal possible syndesmosis  Plan: ORIF    Edmonia Lynch, D, MD Cell [phone]   12/27/2013 7:43 AM

## 2013-12-27 NOTE — Anesthesia Preprocedure Evaluation (Signed)
Anesthesia Evaluation  Patient identified by MRN, date of birth, ID band  Reviewed: Allergy & Precautions, H&P , NPO status , Patient's Chart, lab work & pertinent test results  History of Anesthesia Complications Negative for: history of anesthetic complications  Airway Mallampati: II  TM Distance: >3 FB Neck ROM: Full    Dental  (+) Teeth Intact   Pulmonary neg sleep apnea, neg COPDneg recent URI, Current Smoker,  breath sounds clear to auscultation        Cardiovascular negative cardio ROS  Rhythm:Regular     Neuro/Psych negative neurological ROS  negative psych ROS   GI/Hepatic negative GI ROS, Neg liver ROS,   Endo/Other  negative endocrine ROS  Renal/GU negative Renal ROS     Musculoskeletal Right ankle fracture   Abdominal   Peds  Hematology negative hematology ROS (+)   Anesthesia Other Findings   Reproductive/Obstetrics                             Anesthesia Physical Anesthesia Plan  ASA: II  Anesthesia Plan: General and Regional   Post-op Pain Management:    Induction: Intravenous  Airway Management Planned: LMA  Additional Equipment: None  Intra-op Plan:   Post-operative Plan: Extubation in OR  Informed Consent: I have reviewed the patients History and Physical, chart, labs and discussed the procedure including the risks, benefits and alternatives for the proposed anesthesia with the patient or authorized representative who has indicated his/her understanding and acceptance.   Dental advisory given  Plan Discussed with: CRNA and Surgeon  Anesthesia Plan Comments:         Anesthesia Quick Evaluation

## 2013-12-27 NOTE — Progress Notes (Signed)
Assisted Dr. Moser with right, ultrasound guided, popliteal block. Side rails up, monitors on throughout procedure. See vital signs in flow sheet. Tolerated Procedure well. 

## 2014-01-02 ENCOUNTER — Encounter (HOSPITAL_BASED_OUTPATIENT_CLINIC_OR_DEPARTMENT_OTHER): Payer: Self-pay | Admitting: Orthopedic Surgery

## 2014-05-15 ENCOUNTER — Emergency Department (HOSPITAL_COMMUNITY)
Admission: EM | Admit: 2014-05-15 | Discharge: 2014-05-15 | Disposition: A | Discharge status: Home / Self Care | Payer: Self-pay | Attending: Emergency Medicine | Admitting: Emergency Medicine

## 2014-05-15 ENCOUNTER — Emergency Department (HOSPITAL_COMMUNITY): Payer: Self-pay

## 2014-05-15 ENCOUNTER — Encounter (HOSPITAL_COMMUNITY): Payer: Self-pay

## 2014-05-15 DIAGNOSIS — Y998 Other external cause status: Secondary | ICD-10-CM | POA: Insufficient documentation

## 2014-05-15 DIAGNOSIS — Z8781 Personal history of (healed) traumatic fracture: Secondary | ICD-10-CM | POA: Insufficient documentation

## 2014-05-15 DIAGNOSIS — S80862A Insect bite (nonvenomous), left lower leg, initial encounter: Secondary | ICD-10-CM | POA: Insufficient documentation

## 2014-05-15 DIAGNOSIS — S40862A Insect bite (nonvenomous) of left upper arm, initial encounter: Secondary | ICD-10-CM | POA: Insufficient documentation

## 2014-05-15 DIAGNOSIS — Y9389 Activity, other specified: Secondary | ICD-10-CM | POA: Insufficient documentation

## 2014-05-15 DIAGNOSIS — Y9289 Other specified places as the place of occurrence of the external cause: Secondary | ICD-10-CM | POA: Insufficient documentation

## 2014-05-15 DIAGNOSIS — W57XXXA Bitten or stung by nonvenomous insect and other nonvenomous arthropods, initial encounter: Secondary | ICD-10-CM | POA: Insufficient documentation

## 2014-05-15 DIAGNOSIS — Z79899 Other long term (current) drug therapy: Secondary | ICD-10-CM | POA: Insufficient documentation

## 2014-05-15 DIAGNOSIS — S40861A Insect bite (nonvenomous) of right upper arm, initial encounter: Secondary | ICD-10-CM | POA: Insufficient documentation

## 2014-05-15 DIAGNOSIS — S80861A Insect bite (nonvenomous), right lower leg, initial encounter: Secondary | ICD-10-CM | POA: Insufficient documentation

## 2014-05-15 DIAGNOSIS — Z720 Tobacco use: Secondary | ICD-10-CM | POA: Insufficient documentation

## 2014-05-15 DIAGNOSIS — Z7982 Long term (current) use of aspirin: Secondary | ICD-10-CM | POA: Insufficient documentation

## 2014-05-15 DIAGNOSIS — B349 Viral infection, unspecified: Secondary | ICD-10-CM | POA: Insufficient documentation

## 2014-05-15 DIAGNOSIS — R509 Fever, unspecified: Secondary | ICD-10-CM

## 2014-05-15 LAB — CBC WITH DIFFERENTIAL/PLATELET
Basophils Absolute: 0 10*3/uL (ref 0.0–0.1)
Basophils Relative: 0 % (ref 0–1)
Eosinophils Absolute: 0 10*3/uL (ref 0.0–0.7)
Eosinophils Relative: 0 % (ref 0–5)
HCT: 30.7 % — ABNORMAL LOW (ref 39.0–52.0)
HEMOGLOBIN: 10.4 g/dL — AB (ref 13.0–17.0)
LYMPHS ABS: 1 10*3/uL (ref 0.7–4.0)
LYMPHS PCT: 10 % — AB (ref 12–46)
MCH: 30.6 pg (ref 26.0–34.0)
MCHC: 33.9 g/dL (ref 30.0–36.0)
MCV: 90.3 fL (ref 78.0–100.0)
Monocytes Absolute: 0.9 10*3/uL (ref 0.1–1.0)
Monocytes Relative: 10 % (ref 3–12)
Neutro Abs: 7.3 10*3/uL (ref 1.7–7.7)
Neutrophils Relative %: 80 % — ABNORMAL HIGH (ref 43–77)
PLATELETS: 416 10*3/uL — AB (ref 150–400)
RBC: 3.4 MIL/uL — ABNORMAL LOW (ref 4.22–5.81)
RDW: 12.6 % (ref 11.5–15.5)
WBC: 9.2 10*3/uL (ref 4.0–10.5)

## 2014-05-15 LAB — COMPREHENSIVE METABOLIC PANEL
ALBUMIN: 2.5 g/dL — AB (ref 3.5–5.2)
ALT: 35 U/L (ref 0–53)
AST: 46 U/L — AB (ref 0–37)
Alkaline Phosphatase: 54 U/L (ref 39–117)
Anion gap: 9 (ref 5–15)
BILIRUBIN TOTAL: 0.6 mg/dL (ref 0.3–1.2)
BUN: 7 mg/dL (ref 6–23)
CALCIUM: 8.3 mg/dL — AB (ref 8.4–10.5)
CO2: 28 mmol/L (ref 19–32)
CREATININE: 1.14 mg/dL (ref 0.50–1.35)
Chloride: 95 mmol/L — ABNORMAL LOW (ref 96–112)
GFR calc Af Amer: >90 mL/min (ref >90)
GFR calc non Af Amer: 79 mL/min — ABNORMAL LOW (ref >90)
Glucose, Bld: 106 mg/dL — ABNORMAL HIGH (ref 70–99)
Potassium: 3.5 mmol/L (ref 3.5–5.1)
Sodium: 132 mmol/L — ABNORMAL LOW (ref 135–145)
TOTAL PROTEIN: 6.3 g/dL (ref 6.0–8.3)

## 2014-05-15 LAB — RAPID STREP SCREEN (MED CTR MEBANE ONLY): STREPTOCOCCUS, GROUP A SCREEN (DIRECT): NEGATIVE

## 2014-05-15 MED ORDER — ACETAMINOPHEN 325 MG PO TABS
650.0000 mg | ORAL_TABLET | Freq: Four times a day (QID) | ORAL | Status: DC | PRN
  Administered 2014-05-15: 650 mg via ORAL
  Filled 2014-05-15: qty 2

## 2014-05-15 MED ORDER — IBUPROFEN 600 MG PO TABS: ORAL_TABLET | ORAL | Status: AC

## 2014-05-15 MED ORDER — BENZONATATE 100 MG PO CAPS: 100.0000 mg | ORAL_CAPSULE | Freq: Three times a day (TID) | ORAL | Status: AC

## 2014-05-15 NOTE — ED Provider Notes (Signed)
CSN: [id number]     Arrival date & time 05/15/14  1215 History   First MD Initiated Contact with Patient 05/15/14 1448     Chief Complaint  Patient presents with  . Generalized Body Aches  . Rash     (Consider location/radiation/quality/duration/timing/severity/associated sxs/prior Treatment) Patient is a 39 y.o. male presenting with rash. The history is provided by the patient. No language interpreter was used.  Rash Location:  Leg and shoulder/arm Shoulder/arm rash location:  L arm and R arm Leg rash location:  L leg and R leg Quality: redness   Severity:  Moderate Onset quality:  Gradual Duration:  3 days Timing:  Constant Progression:  Worsening Chronicity:  New Relieved by:  Nothing Worsened by:  Nothing tried Ineffective treatments:  None tried Associated symptoms: fever and myalgias     Past Medical History  Diagnosis Date  . Fractured lateral malleolus 12/2013    right   Past Surgical History  Procedure Laterality Date  . Knee arthroscopy Left 1985  . Orif ankle fracture Right 12/27/2013    Procedure: OPEN REDUCTION INTERNAL FIXATION (ORIF) RIGHT LATERAL MALLEOLUS ;  Surgeon: Sheral Apley, MD;  [facility];  Service: Orthopedics;  Laterality: Right;   No family history on file. History  Substance Use Topics  . Smoking status: Current Every Day Smoker -- 0.50 packs/day for 15 years    Types: Cigarettes  . Smokeless tobacco: Never Used  . Alcohol Use: No    Review of Systems  Constitutional: Positive for fever.  Musculoskeletal: Positive for myalgias.  Skin: Positive for rash.  All other systems reviewed and are negative.     Allergies  Review of patient's allergies indicates no known allergies.  Home Medications   Prior to Admission medications   Medication Sig Start Date End Date Taking? Authorizing Provider  aspirin 81 MG tablet Take 1 tablet (81 mg total) by mouth daily. 12/27/13   Sheral Apley, MD  docusate  sodium (COLACE) 100 MG capsule Take 1 capsule (100 mg total) by mouth 2 (two) times daily. 12/27/13   Sheral Apley, MD  ondansetron (ZOFRAN) 4 MG tablet Take 1 tablet (4 mg total) by mouth every 8 (eight) hours as needed for nausea or vomiting. 12/27/13   Sheral Apley, MD  oxyCODONE-acetaminophen (ROXICET) 5-325 MG per tablet Take 2 tablets by mouth every 4 (four) hours as needed. 12/27/13   Sheral Apley, MD   BP 122/69 mmHg  Pulse 70  Temp(Src) 99.1 F (37.3 C) (Oral)  Resp 18  SpO2 98% Physical Exam  Constitutional: He is oriented to person, place, and time. He appears well-developed and well-nourished.  HENT:  Head: Normocephalic.  Eyes: EOM are normal.  Neck: Normal range of motion.  Pulmonary/Chest: Effort normal.  Abdominal: He exhibits no distension.  Musculoskeletal: Normal range of motion.  Neurological: He is alert and oriented to person, place, and time.  Psychiatric: He has a normal mood and affect.  Nursing note and vitals reviewed.   ED Course  Procedures (including critical care time) Labs Review Labs Reviewed  CBC WITH DIFFERENTIAL/PLATELET - Abnormal; Notable for the following:    RBC 3.40 (*)    Hemoglobin 10.4 (*)    HCT 30.7 (*)    Platelets 416 (*)    Neutrophils Relative % 80 (*)    Lymphocytes Relative 10 (*)    All other components within normal limits  COMPREHENSIVE METABOLIC PANEL - Abnormal; Notable for the following:  Sodium 132 (*)    Chloride 95 (*)    Glucose, Bld 106 (*)    Calcium 8.3 (*)    Albumin 2.5 (*)    AST 46 (*)    GFR calc non Af Amer 79 (*)    All other components within normal limits  RAPID STREP SCREEN  CULTURE, GROUP A STREP  RPR  HIV ANTIBODY (ROUTINE TESTING)    Imaging Review No results found.   EKG Interpretation None      MDM  Dr. Madilyn Hookees in to see.   Dr. Effie Shywentz in to see.   Probable influenza and bedbugs.  Pt counseled, labs pending   Final diagnoses:  Fever  Viral illness  Insect bites          Elson AreasLeslie K Shakura Cowing, PA-C 05/16/14 0615  Tilden FossaElizabeth Rees, MD 05/16/14 0700

## 2014-05-15 NOTE — ED Notes (Signed)
Pt. States he started having cold symptoms including generalized body aches, chills/hot flashes. States on Friday started breaking out in a rash all over body and having eye swelling.

## 2014-05-15 NOTE — Discharge Instructions (Signed)
Bedbugs Bedbugs are tiny bugs that live in and around beds. During the day, they hide in mattresses and other places near beds. They come out at night and bite people lying in bed. They need blood to live and grow. Bedbugs can be found in beds anywhere. Usually, they are found in places where many people come and go (hotels, shelters, hospitals). It does not matter whether the place is dirty or clean. Getting bitten by bedbugs rarely causes a medical problem. The biggest problem can be getting rid of them. This often takes the work of a Financial risk analyst. CAUSES  Less use of pesticides. Bedbugs were common before the 1950s. Then, strong pesticides such as DDT nearly wiped them out. Today, these pesticides are not used because they harm the environment and can cause health problems.  More travel. Besides mattresses, bedbugs can also live in clothing and luggage. They can come along as people travel from place to place. Bedbugs are more common in certain parts of the world. When people travel to those areas, the bugs can come home with them.  Presence of birds and bats. Bedbugs often infest birds and bats. If you have these animals in or near your home, bedbugs may infest your house, too. SYMPTOMS It does not hurt to be bitten by a bedbug. You will probably not wake up when you are bitten. Bedbugs usually bite areas of the skin that are not covered. Symptoms may show when you wake up, or they may take a day or more to show up. Symptoms may include:  Small red bumps on the skin. These might be lined up in a row or clustered in a group.  A darker red dot in the middle of red bumps.  Blisters on the skin. There may be swelling and very bad itching. These may be signs of an allergic reaction. This does not happen often. DIAGNOSIS Bedbug bites might look and feel like other types of insect bites. The bugs do not stay on the body like ticks or lice. They bite, drop off, and crawl away to hide. Your  caregiver will probably:  Ask about your symptoms.  Ask about your recent activities and travel.  Check your skin for bedbug bites.  Ask you to check at home for signs of bedbugs. You should look for:  Spots or stains on the bed or nearby. This could be from bedbugs that were crushed or from their eggs or waste.  Bedbugs themselves. They are reddish-brown, oval, and flat. They do not fly. They are about the size of an apple seed.  Places to look for bedbugs include:  Beds. Check mattresses, headboards, box springs, and bed frames.  On drapes and curtains near the bed.  Under carpeting in the bedroom.  Behind electrical outlets.  Behind any wallpaper that is peeling.  Inside luggage. TREATMENT Most bedbug bites do not need treatment. They usually go away on their own in a few days. The bites are not dangerous. However, treatment may be needed if you have scratched so much that your skin has become infected. You may also need treatment if you are allergic to bedbug bites. Treatment options include:  A drug that stops swelling and itching (corticosteroid). Usually, a cream is rubbed on the skin. If you have a bad rash, you may be given a corticosteroid pill.  Oral antihistamines. These are pills to help control itching.  Antibiotic medicines. An antibiotic may be prescribed for infected skin. HOME CARE INSTRUCTIONS  Take any medicine prescribed by your caregiver for your bites. Follow the directions carefully.  Consider wearing pajamas with long sleeves and pant legs.  Your bedroom may need to be treated. A pest control expert should make sure the bedbugs are gone. You may need to throw away mattresses or luggage. Ask the pest control expert what you can do to keep the bedbugs from coming back. Common suggestions include:  Putting a plastic cover over your mattress.  Washing and drying your clothes and bedding in hot water and a hot dryer. The temperature should be hotter  than 120 F (48.9 C). Bedbugs are killed by high temperatures.  Vacuuming carefully all around your bed. Vacuum in all cracks and crevices where the bugs might hide. Do this often.  Carefully checking all used furniture, bedding, or clothes that you bring into your house.  Eliminating bird nests and bat roosts.  If you get bedbug bites when traveling, check all your possessions carefully before bringing them into your house. If you find any bugs on clothes or in your luggage, consider throwing those items away. SEEK MEDICAL CARE IF:  You have red bug bites that keep coming back.  You have red bug bites that itch badly.  You have bug bites that cause a skin rash.  You have scratch marks that are red and sore. SEEK IMMEDIATE MEDICAL CARE IF: You have a fever. Document Released: 02/22/2010 Document Revised: 04/14/2011 Document Reviewed: 02/22/2010 [facility] Patient Information 2015 [place], [place]. This information is not intended to replace advice given to you by your health care provider. Make sure you discuss any questions you have with your health care provider. Influenza Influenza ("the flu") is a viral infection of the respiratory tract. It occurs more often in winter months because people spend more time in close contact with one another. Influenza can make you feel very sick. Influenza easily spreads from person to person (contagious). CAUSES  Influenza is caused by a virus that infects the respiratory tract. You can catch the virus by breathing in droplets from an infected person's cough or sneeze. You can also catch the virus by touching something that was recently contaminated with the virus and then touching your mouth, nose, or eyes. RISKS AND COMPLICATIONS You may be at risk for a more severe case of influenza if you smoke cigarettes, have diabetes, have chronic heart disease (such as heart failure) or lung disease (such as asthma), or if you have a weakened immune system.  Elderly people and pregnant women are also at risk for more serious infections. The most common problem of influenza is a lung infection (pneumonia). Sometimes, this problem can require emergency medical care and may be life threatening. SIGNS AND SYMPTOMS  Symptoms typically last 4 to 10 days and may include:  Fever.  Chills.  Headache, body aches, and muscle aches.  Sore throat.  Chest discomfort and cough.  Poor appetite.  Weakness or feeling tired.  Dizziness.  Nausea or vomiting. DIAGNOSIS  Diagnosis of influenza is often made based on your history and a physical exam. A nose or throat swab test can be done to confirm the diagnosis. TREATMENT  In mild cases, influenza goes away on its own. Treatment is directed at relieving symptoms. For more severe cases, your health care provider may prescribe antiviral medicines to shorten the sickness. Antibiotic medicines are not effective because the infection is caused by a virus, not by bacteria. HOME CARE INSTRUCTIONS  Take medicines only as directed by your  health care provider.  Use a cool mist humidifier to make breathing easier.  Get plenty of rest until your temperature returns to normal. This usually takes 3 to 4 days.  Drink enough fluid to keep your urine clear or pale yellow.  Cover yourmouth and nosewhen coughing or sneezing,and wash your handswellto prevent thevirusfrom spreading.  Stay homefromwork orschool untilthe fever is gonefor at least 551full day. PREVENTION  An annual influenza vaccination (flu shot) is the best way to avoid getting influenza. An annual flu shot is now routinely recommended for all adults in the [place] SEEK MEDICAL CARE IF:  You experiencechest pain, yourcough worsens,or you producemore mucus.  Youhave nausea,vomiting, ordiarrhea.  Your fever returns or gets worse. SEEK IMMEDIATE MEDICAL CARE IF:  You havetrouble breathing, you become short of breath,or your skin  ornails becomebluish.  You have severe painor stiffnessin the neck.  You develop a sudden headache, or pain in the face or ear.  You have nausea or vomiting that you cannot control. MAKE SURE YOU:   Understand these instructions.  Will watch your condition.  Will get help right away if you are not doing well or get worse. Document Released: 01/18/2000 Document Revised: 06/06/2013 Document Reviewed: 04/21/2011 [facility] Patient Information 2015 [place], [place]. This information is not intended to replace advice given to you by your health care provider. Make sure you discuss any questions you have with your health care provider.

## 2014-05-16 LAB — HIV ANTIBODY (ROUTINE TESTING W REFLEX): HIV Screen 4th Generation wRfx: NONREACTIVE

## 2014-05-16 LAB — RPR: RPR: NONREACTIVE

## 2014-05-17 LAB — CULTURE, GROUP A STREP: Strep A Culture: NEGATIVE

## 2014-05-19 ENCOUNTER — Emergency Department (HOSPITAL_COMMUNITY)
Admission: EM | Admit: 2014-05-19 | Discharge: 2014-05-19 | Disposition: A | Discharge status: Home / Self Care | Payer: Self-pay | Attending: Emergency Medicine | Admitting: Emergency Medicine

## 2014-05-19 ENCOUNTER — Encounter (HOSPITAL_COMMUNITY): Payer: Self-pay | Admitting: Emergency Medicine

## 2014-05-19 DIAGNOSIS — H01009 Unspecified blepharitis unspecified eye, unspecified eyelid: Secondary | ICD-10-CM | POA: Insufficient documentation

## 2014-05-19 DIAGNOSIS — Z720 Tobacco use: Secondary | ICD-10-CM | POA: Insufficient documentation

## 2014-05-19 DIAGNOSIS — H01003 Unspecified blepharitis right eye, unspecified eyelid: Secondary | ICD-10-CM

## 2014-05-19 DIAGNOSIS — Z79899 Other long term (current) drug therapy: Secondary | ICD-10-CM | POA: Insufficient documentation

## 2014-05-19 DIAGNOSIS — H01006 Unspecified blepharitis left eye, unspecified eyelid: Secondary | ICD-10-CM

## 2014-05-19 DIAGNOSIS — Z7982 Long term (current) use of aspirin: Secondary | ICD-10-CM | POA: Insufficient documentation

## 2014-05-19 MED ORDER — FLUORESCEIN SODIUM 1 MG OP STRP
1.0000 | ORAL_STRIP | Freq: Once | OPHTHALMIC | Status: DC
Start: 2014-05-19 — End: 2014-05-19
  Filled 2014-05-19: qty 1

## 2014-05-19 MED ORDER — POLYMYXIN B-TRIMETHOPRIM 10000-0.1 UNIT/ML-% OP SOLN: 1.0000 [drp] | OPHTHALMIC | Status: AC

## 2014-05-19 MED ORDER — TETRACAINE HCL 0.5 % OP SOLN
1.0000 [drp] | Freq: Once | OPHTHALMIC | Status: DC
  Filled 2014-05-19: qty 2

## 2014-05-19 MED ORDER — DIPHENHYDRAMINE HCL 25 MG PO TABS: 25.0000 mg | ORAL_TABLET | Freq: Four times a day (QID) | ORAL | Status: AC

## 2014-05-19 NOTE — ED Notes (Signed)
Patient states eyes red, swollen and matted together when he wakes up.   Patient states has a lot of yellow/green drainage from his eyes.   Patient states he was seen for the same Monday.

## 2014-05-19 NOTE — ED Notes (Signed)
Pt reports being seen here on 4/11 for virus and eye puffiness, was told he has a virus. Still has swelling to bilateral eyelids. Denies itching or pain.

## 2014-05-19 NOTE — Discharge Instructions (Signed)
Please read and follow all provided instructions.  Your diagnoses today include:  1. Blepharitis of both eyes     Tests performed today include:  Visual acuity testing to check your vision  Vital signs. See below for your results today.   Medications prescribed:   Polytrim (polymyxin B/trimethoprim) - antibiotic eye drops  Use this medication as follows:  Use 1 drop in affected eye every 4 hours while awake for 10 days. Do not exceed 6 doses in 24 hours.   Benadryl (diphenhydramine) - antihistamine  You can find this medication over-the-counter.   DO NOT exceed:   50mg  Benadryl every 6 hours    Benadryl will make you drowsy. DO NOT drive or perform any activities that require you to be awake and alert if taking this.  Take any prescribed medications only as directed.  Home care instructions:  Follow any educational materials contained in this packet. If you wear contact lenses, do not use them until your eye caregiver approves. Follow-up care is necessary to be sure the infection is healing if not completely resolved in 2-3 days. See your caregiver or eye specialist as suggested for followup.   Follow-up instructions: Please follow-up with your primary care doctor OR the opthalmologist listed in the next 2-3 days for further evaluation of your symptoms.  Return instructions:   Please return to the Emergency Department if you experience worsening symptoms.   Please return immediately if you develop severe pain, pus drainage, new change in vision, or fever.  Please return if you have any other emergent concerns.  Additional Information:  Your vital signs today were: BP 116/63 mmHg   Pulse 75   Temp(Src) 98.6 F (37 C) (Oral)   Resp 18   Ht 5\' 7"  (1.702 m)   Wt 155 lb (70.308 kg)   BMI 24.27 kg/m2   SpO2 99% If your blood pressure (BP) was elevated above 135/85 this visit, please have this repeated by your doctor within one month. ---------------

## 2014-05-19 NOTE — ED Provider Notes (Signed)
CSN: [id number]     Arrival date & time 05/19/14  [id number] History  This chart was scribed for non-physician practitioner, Renne CriglerJoshua Suhailah Kwan, PA-C working with Samuel JesterKathleen McManus, DO by Gwenyth Oberatherine Macek, ED scribe. This patient was seen in room TR06C/TR06C and the patient's care was started at 9:51 AM.     Chief Complaint  Patient presents with  . Conjunctivitis   The history is provided by the patient. No language interpreter was used.   HPI Comments: Jared Morales is a 39 y.o. male who presents to the Emergency Department complaining of constant, gradual onset, moderate bilateral eye swelling that started 3 days ago. Pt states HA,  photophobia and eye watering as associated symptoms. He has tried Ibuprofen and Tessalon with no relief. Pt was seen in the ED 4 days ago for fever, generalized body aches and neck pain, which are currently resolved. He denies a history of allergies. Pt also denies blurred vision, SOB, CP and wheezing as associated symptoms. He continues taking the tessalon. He has had ibuprofen in past without problems.   Past Medical History  Diagnosis Date  . Fractured lateral malleolus 12/2013    right   Past Surgical History  Procedure Laterality Date  . Knee arthroscopy Left 1985  . Orif ankle fracture Right 12/27/2013    Procedure: OPEN REDUCTION INTERNAL FIXATION (ORIF) RIGHT LATERAL MALLEOLUS ;  Surgeon: Sheral Apleyimothy D Murphy, MD;  Location: [facility];  Service: Orthopedics;  Laterality: Right;   No family history on file. History  Substance Use Topics  . Smoking status: Current Every Day Smoker -- 0.50 packs/day for 15 years    Types: Cigarettes  . Smokeless tobacco: Never Used  . Alcohol Use: No    Review of Systems  Constitutional: Negative for fever.  HENT: Negative for trouble swallowing.   Eyes: Positive for photophobia and pain. Negative for redness and visual disturbance.  Respiratory: Negative for shortness of breath, wheezing and stridor.    Cardiovascular: Negative for chest pain.  Gastrointestinal: Negative for nausea and vomiting.  Musculoskeletal: Negative for myalgias and neck pain.  Skin: Negative for rash.  Neurological: Positive for headaches. Negative for light-headedness.  Psychiatric/Behavioral: Negative for confusion.      Allergies  Review of patient's allergies indicates no known allergies.  Home Medications   Prior to Admission medications   Medication Sig Start Date End Date Taking? Authorizing Provider  aspirin 81 MG tablet Take 1 tablet (81 mg total) by mouth daily. 12/27/13   Sheral Apleyimothy D Murphy, MD  benzonatate (TESSALON) 100 MG capsule Take 1 capsule (100 mg total) by mouth every 8 (eight) hours. 05/15/14   Elson AreasLeslie K Sofia, PA-C  docusate sodium (COLACE) 100 MG capsule Take 1 capsule (100 mg total) by mouth 2 (two) times daily. 12/27/13   Sheral Apleyimothy D Murphy, MD  ibuprofen (ADVIL,MOTRIN) 600 MG tablet One tablet po qid 05/15/14   Elson AreasLeslie K Sofia, PA-C  ondansetron (ZOFRAN) 4 MG tablet Take 1 tablet (4 mg total) by mouth every 8 (eight) hours as needed for nausea or vomiting. 12/27/13   Sheral Apleyimothy D Murphy, MD  oxyCODONE-acetaminophen (ROXICET) 5-325 MG per tablet Take 2 tablets by mouth every 4 (four) hours as needed. 12/27/13   Sheral Apleyimothy D Murphy, MD   BP 116/63 mmHg  Pulse 75  Temp(Src) 98.6 F (37 C) (Oral)  Resp 18  Ht 5\' 7"  (1.702 m)  Wt 155 lb (70.308 kg)  BMI 24.27 kg/m2  SpO2 99%   Physical Exam  Constitutional:  He appears well-developed and well-nourished. No distress.  HENT:  Head: Normocephalic and atraumatic.  Eyes: Conjunctivae and EOM are normal.  Lids swollen bilaterally. No surrounding tenderness suspicious for periorbital/orbital cellulitis. Globes themselves appear normal.  Neck: Normal range of motion. Neck supple. No tracheal deviation present.  Cardiovascular: Normal rate.   Pulmonary/Chest: Effort normal. No respiratory distress.  Neurological: He is alert.  Skin: Skin is warm and  dry.  Psychiatric: He has a normal mood and affect. His behavior is normal.  Nursing note and vitals reviewed.   ED Course  Procedures   DIAGNOSTIC STUDIES: Oxygen Saturation is 99% on RA, normal by my interpretation.    COORDINATION OF CARE: 9:57 AM Discussed treatment plan with pt which includes Benadryl and follow-up with opthalmology. Advised pt to stop Tessalon treatment due to possible allergy. Pt agreed to plan.   Labs Review Labs Reviewed - No data to display  Imaging Review No results found.   EKG Interpretation None       Vital signs reviewed and are as follows: Filed Vitals:   05/19/14 0938  BP: 116/63  Pulse: 75  Temp: 98.6 F (37 C)  Resp: 18     MDM   Final diagnoses:  Blepharitis of both eyes   Pt with bilateral blepharitis. No foreign bodies noted. No surrounding erythema, vision changes/loss suspicious for orbital or periorbital cellulitis. No signs of iritis. No signs of glaucoma. No symptoms of retinal detachment. No ophthalmologic emergency suspected. Outpatient referral given in case of no improvement.    I personally performed the services described in this documentation, which was scribed in my presence. The recorded information has been reviewed and is accurate.    Renne Crigler, PA-C 05/19/14 1023  Samuel Jester, DO 05/20/14 [phone]

## 2014-08-05 ENCOUNTER — Emergency Department (HOSPITAL_COMMUNITY)
Admission: EM | Admit: 2014-08-05 | Discharge: 2014-08-05 | Disposition: A | Discharge status: Home / Self Care | Payer: Self-pay | Attending: Emergency Medicine | Admitting: Emergency Medicine

## 2014-08-05 ENCOUNTER — Encounter (HOSPITAL_COMMUNITY): Payer: Self-pay | Admitting: *Deleted

## 2014-08-05 DIAGNOSIS — Z8781 Personal history of (healed) traumatic fracture: Secondary | ICD-10-CM | POA: Insufficient documentation

## 2014-08-05 DIAGNOSIS — T405X1A Poisoning by cocaine, accidental (unintentional), initial encounter: Secondary | ICD-10-CM | POA: Insufficient documentation

## 2014-08-05 DIAGNOSIS — R55 Syncope and collapse: Secondary | ICD-10-CM | POA: Insufficient documentation

## 2014-08-05 DIAGNOSIS — T50901A Poisoning by unspecified drugs, medicaments and biological substances, accidental (unintentional), initial encounter: Secondary | ICD-10-CM

## 2014-08-05 DIAGNOSIS — Z720 Tobacco use: Secondary | ICD-10-CM | POA: Insufficient documentation

## 2014-08-05 DIAGNOSIS — Y9289 Other specified places as the place of occurrence of the external cause: Secondary | ICD-10-CM | POA: Insufficient documentation

## 2014-08-05 DIAGNOSIS — Y998 Other external cause status: Secondary | ICD-10-CM | POA: Insufficient documentation

## 2014-08-05 DIAGNOSIS — Y9389 Activity, other specified: Secondary | ICD-10-CM | POA: Insufficient documentation

## 2014-08-05 DIAGNOSIS — F1210 Cannabis abuse, uncomplicated: Secondary | ICD-10-CM | POA: Insufficient documentation

## 2014-08-05 HISTORY — DX: Other psychoactive substance abuse, uncomplicated: F19.10

## 2014-08-05 LAB — SALICYLATE LEVEL: Salicylate Lvl: <4 mg/dL (ref 2.8–30.0)

## 2014-08-05 LAB — CBC WITH DIFFERENTIAL/PLATELET
BASOS ABS: 0 10*3/uL (ref 0.0–0.1)
Basophils Relative: 0 % (ref 0–1)
Eosinophils Absolute: 0.3 10*3/uL (ref 0.0–0.7)
Eosinophils Relative: 6 % — ABNORMAL HIGH (ref 0–5)
HEMATOCRIT: 42.5 % (ref 39.0–52.0)
HEMOGLOBIN: 14.1 g/dL (ref 13.0–17.0)
LYMPHS ABS: 3.1 10*3/uL (ref 0.7–4.0)
Lymphocytes Relative: 54 % — ABNORMAL HIGH (ref 12–46)
MCH: 30.7 pg (ref 26.0–34.0)
MCHC: 33.2 g/dL (ref 30.0–36.0)
MCV: 92.6 fL (ref 78.0–100.0)
Monocytes Absolute: 0.4 10*3/uL (ref 0.1–1.0)
Monocytes Relative: 8 % (ref 3–12)
NEUTROS ABS: 1.8 10*3/uL (ref 1.7–7.7)
Neutrophils Relative %: 32 % — ABNORMAL LOW (ref 43–77)
PLATELETS: 244 10*3/uL (ref 150–400)
RBC: 4.59 MIL/uL (ref 4.22–5.81)
RDW: 13.9 % (ref 11.5–15.5)
WBC: 5.6 10*3/uL (ref 4.0–10.5)

## 2014-08-05 LAB — URINALYSIS, ROUTINE W REFLEX MICROSCOPIC
Bilirubin Urine: NEGATIVE
GLUCOSE, UA: NEGATIVE mg/dL
HGB URINE DIPSTICK: NEGATIVE
Ketones, ur: NEGATIVE mg/dL
Leukocytes, UA: NEGATIVE
Nitrite: NEGATIVE
Protein, ur: NEGATIVE mg/dL
SPECIFIC GRAVITY, URINE: 1.013 (ref 1.005–1.030)
Urobilinogen, UA: 0.2 mg/dL (ref 0.0–1.0)
pH: 5.5 (ref 5.0–8.0)

## 2014-08-05 LAB — RAPID URINE DRUG SCREEN, HOSP PERFORMED
AMPHETAMINES: NOT DETECTED
BENZODIAZEPINES: NOT DETECTED
Barbiturates: NOT DETECTED
Cocaine: NOT DETECTED
Opiates: NOT DETECTED
TETRAHYDROCANNABINOL: POSITIVE — AB

## 2014-08-05 LAB — COMPREHENSIVE METABOLIC PANEL
ALBUMIN: 4.2 g/dL (ref 3.5–5.0)
ALT: 23 U/L (ref 17–63)
ANION GAP: 13 (ref 5–15)
AST: 39 U/L (ref 15–41)
Alkaline Phosphatase: 76 U/L (ref 38–126)
BUN: 9 mg/dL (ref 6–20)
CHLORIDE: 98 mmol/L — AB (ref 101–111)
CO2: 23 mmol/L (ref 22–32)
CREATININE: 1.4 mg/dL — AB (ref 0.61–1.24)
Calcium: 8.8 mg/dL — ABNORMAL LOW (ref 8.9–10.3)
Glucose, Bld: 158 mg/dL — ABNORMAL HIGH (ref 65–99)
Potassium: 3.6 mmol/L (ref 3.5–5.1)
Sodium: 134 mmol/L — ABNORMAL LOW (ref 135–145)
Total Bilirubin: 0.2 mg/dL — ABNORMAL LOW (ref 0.3–1.2)
Total Protein: 8.2 g/dL — ABNORMAL HIGH (ref 6.5–8.1)

## 2014-08-05 LAB — ETHANOL: ALCOHOL ETHYL (B): 235 mg/dL — AB (ref <5)

## 2014-08-05 LAB — TROPONIN I: Troponin I: <0.03 ng/mL (ref <0.031)

## 2014-08-05 LAB — ACETAMINOPHEN LEVEL

## 2014-08-05 MED ORDER — NALOXONE HCL 0.4 MG/ML IJ SOLN: 0.4000 mg | INTRAMUSCULAR | Status: DC | PRN

## 2014-08-05 MED ORDER — NALOXONE HCL 0.4 MG/ML IJ SOLN
0.4000 mg | Freq: Once | INTRAMUSCULAR | Status: DC
  Filled 2014-08-05: qty 1

## 2014-08-05 NOTE — Discharge Instructions (Signed)
Accidental Overdose  A drug overdose occurs when a chemical substance (drug or medication) is used in amounts large enough to overcome a person. This may result in severe illness or death. This is a type of poisoning. Accidental overdoses of medications or other substances come from a variety of reasons. When this happens accidentally, it is often because the person taking the substance does not know enough about what they have taken. Drugs which commonly cause overdose deaths are alcohol, psychotropic medications (medications which affect the mind), pain medications, illegal drugs (street drugs) such as cocaine and heroin, and multiple drugs taken at the same time. It may result from careless behavior (such as over-indulging at a party). Other causes of overdose may include multiple drug use, a lapse in memory, or drug use after a period of no drug use.   Sometimes overdosing occurs because a person cannot remember if they have taken their medication.   A common unintentional overdose in young children involves multi-vitamins containing iron. Iron is a part of the hemoglobin molecule in blood. It is used to transport oxygen to living cells. When taken in small amounts, iron allows the body to restock hemoglobin. In large amounts, it causes problems in the body. If this overdose is not treated, it can lead to death.  Never take medicines that show signs of tampering or do not seem quite right. Never take medicines in the dark or in poor lighting. Read the label and check each dose of medicine before you take it. When adults are poisoned, it happens most often through carelessness or lack of information. Taking medicines in the dark or taking medicine prescribed for someone else to treat the same type of problem is a dangerous practice.  SYMPTOMS   Symptoms of overdose depend on the medication and amount taken. They can vary from over-activity with stimulant over-dosage, to sleepiness from depressants such as  alcohol, narcotics and tranquilizers. Confusion, dizziness, nausea and vomiting may be present. If problems are severe enough coma and death may result.  DIAGNOSIS   Diagnosis and management are generally straightforward if the drug is known. Otherwise it is more difficult. At times, certain symptoms and signs exhibited by the patient, or blood tests, can reveal the drug in question.   TREATMENT   In an emergency department, most patients can be treated with supportive measures. Antidotes may be available if there has been an overdose of opioids or benzodiazepines. A rapid improvement will often occur if this is the cause of overdose.  At home or away from medical care:   There may be no immediate problems or warning signs in children.   Not everything works well in all cases of poisoning.   Take immediate action. Poisons may act quickly.   If you think someone has swallowed medicine or a household product, and the person is unconscious, having seizures (convulsions), or is not breathing, immediately call for an ambulance.  IF a person is conscious and appears to be doing OK but has swallowed a poison:   Do not wait to see what effect the poison will have. Immediately call a poison control center (listed in the white pages of your telephone book under "Poison Control" or inside the front cover with other emergency numbers). Some poison control centers have TTY capability for the deaf. Check with your local center if you or someone in your family requires this service.   Keep the container so you can read the label on the product for ingredients.     Describe what, when, and how much was taken and the age and condition of the person poisoned. Inform them if the person is vomiting, choking, drowsy, shows a change in color or temperature of skin, is conscious or unconscious, or is convulsing.   Do not cause vomiting unless instructed by medical personnel. Do not induce vomiting or force liquids into a person who  is convulsing, unconscious, or very drowsy.  Stay calm and in control.    Activated charcoal also is sometimes used in certain types of poisoning and you may wish to add a supply to your emergency medicines. It is available without a prescription. Call a poison control center before using this medication.  PREVENTION   Thousands of children die every year from unintentional poisoning. This may be from household chemicals, poisoning from carbon monoxide in a car, taking their parent's medications, or simply taking a few iron pills or vitamins with iron. Poisoning comes from unexpected sources.   Store medicines out of the sight and reach of children, preferably in a locked cabinet. Do not keep medications in a food cabinet. Always store your medicines in a secure place. Get rid of expired medications.   If you have children living with you or have them as occasional guests, you should have child-resistant caps on your medicine containers. Keep everything out of reach. Child proof your home.   If you are called to the telephone or to answer the door while you are taking a medicine, take the container with you or put the medicine out of the reach of small children.   Do not take your medication in front of children. Do not tell your child how good a medication is and how good it is for them. They may get the idea it is more of a treat.   If you are an adult and have accidentally taken an overdose, you need to consider how this happened and what can be done to prevent it from happening again. If this was from a street drug or alcohol, determine if there is a problem that needs addressing. If you are not sure a problems exists, it is easy to talk to a professional and ask them if they think you have a problem. It is better to handle this problem in this way before it happens again and has a much worse consequence.  Document Released: 04/05/2004 Document Revised: 04/14/2011 Document Reviewed: 09/11/2008  [employer]  Patient Information 2015 [employer]. This information is not intended to replace advice given to you by your health care provider. Make sure you discuss any questions you have with your health care provider.

## 2014-08-05 NOTE — ED Notes (Signed)
Per [facility] - pt thought he was doing cocaine this evening instead was doing heroin, pt become unresponsive w/ a RR of 4bpm and pt's friend called EMS. Pt received a total of  of narcan, pt's friend reports ETOH use today. Pt alert and confused on arrival however remembers events prior to LOC.

## 2014-08-05 NOTE — ED Notes (Signed)
Bed: RESB Expected date:  Expected time:  Means of arrival:  Comments: Ems Heroin OD/narcaned

## 2014-08-05 NOTE — ED Notes (Signed)
Pt ambulating independently w/ steady gait on d/c in no acute distress, A&Ox4. D/c instructions reviewed w/ pt - pt denies any further questions or concerns at present.  

## 2014-08-05 NOTE — ED Provider Notes (Signed)
CSN: [id number]     Arrival date & time 08/05/14  0139 History   First MD Initiated Contact with Patient 08/05/14 0201     Chief Complaint  Patient presents with  . Drug Overdose     (Consider location/radiation/quality/duration/timing/severity/associated sxs/prior Treatment) HPI Patient admits to snorting a "bump" of what he thought was cocaine this evening. He was found unresponsive with respirations of 4 breaths per minute. Patient's friend called EMS. Received 2 mg IM of Narcan initially with increased alertness. He became more somnolent and was given one IV of Narcan by EMS en route. On arrival to the emergency Department patient with increased lethargy and given another 1 mg. Patient denies any pain currently. States he smoked marijuana and was drinking alcohol. Past Medical History  Diagnosis Date  . Fractured lateral malleolus 12/2013    right  . Substance abuse    Past Surgical History  Procedure Laterality Date  . Knee arthroscopy Left 1985  . Orif ankle fracture Right 12/27/2013    Procedure: OPEN REDUCTION INTERNAL FIXATION (ORIF) RIGHT LATERAL MALLEOLUS ;  Surgeon: Sheral Apley, MD;  Location: [facility];  Service: Orthopedics;  Laterality: Right;   History reviewed. No pertinent family history. History  Substance Use Topics  . Smoking status: Current Every Day Smoker -- 0.25 packs/day for 15 years    Types: Cigarettes  . Smokeless tobacco: Never Used  . Alcohol Use: No    Review of Systems  Constitutional: Negative for fever and chills.  Respiratory: Negative for shortness of breath.   Cardiovascular: Negative for chest pain.  Gastrointestinal: Negative for nausea, vomiting, abdominal pain and diarrhea.  Musculoskeletal: Negative for back pain, neck pain and neck stiffness.  Skin: Negative for rash and wound.  Neurological: Positive for syncope. Negative for dizziness, weakness, light-headedness, numbness and headaches.  All other systems  reviewed and are negative.     Allergies  Review of patient's allergies indicates no known allergies.  Home Medications   Prior to Admission medications   Medication Sig Start Date End Date Taking? Authorizing Provider  aspirin 81 MG tablet Take 1 tablet (81 mg total) by mouth daily. Patient not taking: Reported on 08/05/2014 12/27/13   Sheral Apley, MD  benzonatate (TESSALON) 100 MG capsule Take 1 capsule (100 mg total) by mouth every 8 (eight) hours. Patient not taking: Reported on 08/05/2014 05/15/14   Elson Areas, PA-C  diphenhydrAMINE (BENADRYL) 25 MG tablet Take 1 tablet (25 mg total) by mouth every 6 (six) hours. Patient not taking: Reported on 08/05/2014 05/19/14   Renne Crigler, PA-C  docusate sodium (COLACE) 100 MG capsule Take 1 capsule (100 mg total) by mouth 2 (two) times daily. Patient not taking: Reported on 08/05/2014 12/27/13   Sheral Apley, MD  ibuprofen (ADVIL,MOTRIN) 600 MG tablet One tablet po qid Patient not taking: Reported on 08/05/2014 05/15/14   Elson Areas, PA-C  ondansetron (ZOFRAN) 4 MG tablet Take 1 tablet (4 mg total) by mouth every 8 (eight) hours as needed for nausea or vomiting. Patient not taking: Reported on 08/05/2014 12/27/13   Sheral Apley, MD  oxyCODONE-acetaminophen (ROXICET) 5-325 MG per tablet Take 2 tablets by mouth every 4 (four) hours as needed. Patient not taking: Reported on 08/05/2014 12/27/13   Sheral Apley, MD  trimethoprim-polymyxin b (POLYTRIM) ophthalmic solution Place 1 drop into both eyes every 4 (four) hours. Patient not taking: Reported on 08/05/2014 05/19/14   Renne Crigler, PA-C   BP 136/79  mmHg  Pulse 86  Temp(Src) 97.5 F (36.4 C) (Oral)  Resp 18  Ht 5\' 6"  (1.676 m)  Wt 165 lb (74.844 kg)  BMI 26.64 kg/m2  SpO2 97% Physical Exam  Constitutional: He is oriented to person, place, and time. He appears well-developed and well-nourished. No distress.  HENT:  Head: Normocephalic and atraumatic.  Mouth/Throat:  Oropharynx is clear and moist.  Eyes: EOM are normal. Pupils are equal, round, and reactive to light.  Patient's pupils are 3 mm and minimally reactive  Neck: Normal range of motion. Neck supple.  Cardiovascular: Normal rate and regular rhythm.  Exam reveals no gallop and no friction rub.   No murmur heard. Pulmonary/Chest: Effort normal and breath sounds normal. No respiratory distress. He has no wheezes. He has no rales. He exhibits no tenderness.  Abdominal: Soft. Bowel sounds are normal. He exhibits no distension and no mass. There is no tenderness. There is no rebound and no guarding.  Musculoskeletal: Normal range of motion. He exhibits no edema or tenderness.  Neurological: He is alert and oriented to person, place, and time.  Moves all extremities without deficit. Sensation is grossly intact.  Skin: Skin is warm and dry. No rash noted. No erythema.  Psychiatric: He has a normal mood and affect. His behavior is normal.  No suicidal ideation.  Nursing note and vitals reviewed.   ED Course  Procedures (including critical care time) Labs Review Labs Reviewed  CBC WITH DIFFERENTIAL/PLATELET - Abnormal; Notable for the following:    Neutrophils Relative % 32 (*)    Lymphocytes Relative 54 (*)    Eosinophils Relative 6 (*)    All other components within normal limits  COMPREHENSIVE METABOLIC PANEL - Abnormal; Notable for the following:    Sodium 134 (*)    Chloride 98 (*)    Glucose, Bld 158 (*)    Creatinine, Ser 1.40 (*)    Calcium 8.8 (*)    Total Protein 8.2 (*)    Total Bilirubin 0.2 (*)    All other components within normal limits  ETHANOL - Abnormal; Notable for the following:    Alcohol, Ethyl (B) 235 (*)    All other components within normal limits  URINE RAPID DRUG SCREEN, HOSP PERFORMED - Abnormal; Notable for the following:    Tetrahydrocannabinol POSITIVE (*)    All other components within normal limits  ACETAMINOPHEN LEVEL - Abnormal; Notable for the following:     Acetaminophen (Tylenol), Serum <10 (*)    All other components within normal limits  TROPONIN I  URINALYSIS, ROUTINE W REFLEX MICROSCOPIC (NOT AT [facility])  SALICYLATE LEVEL    Imaging Review No results found.   EKG Interpretation None      MDM   Final diagnoses:  Accidental overdose, initial encounter    Patient is observed in the emergency department for 5 hours. No further doses of Narcan needed. He is ambulating without any difficulty. His vital signs have been stable.  Patient is advised to avoid using heroin or other narcotics.   Loren Raceravid Nashly Olsson, MD 08/05/14 [phone]

## 2015-12-03 IMAGING — CR DG CHEST 2V
2 series · 2 of 2 positions shown · non-contrast
Comparison: None.

CLINICAL DATA: Fever, sore throat, cough for 1 week

EXAM:
CHEST  2 VIEW

[w chest pa]
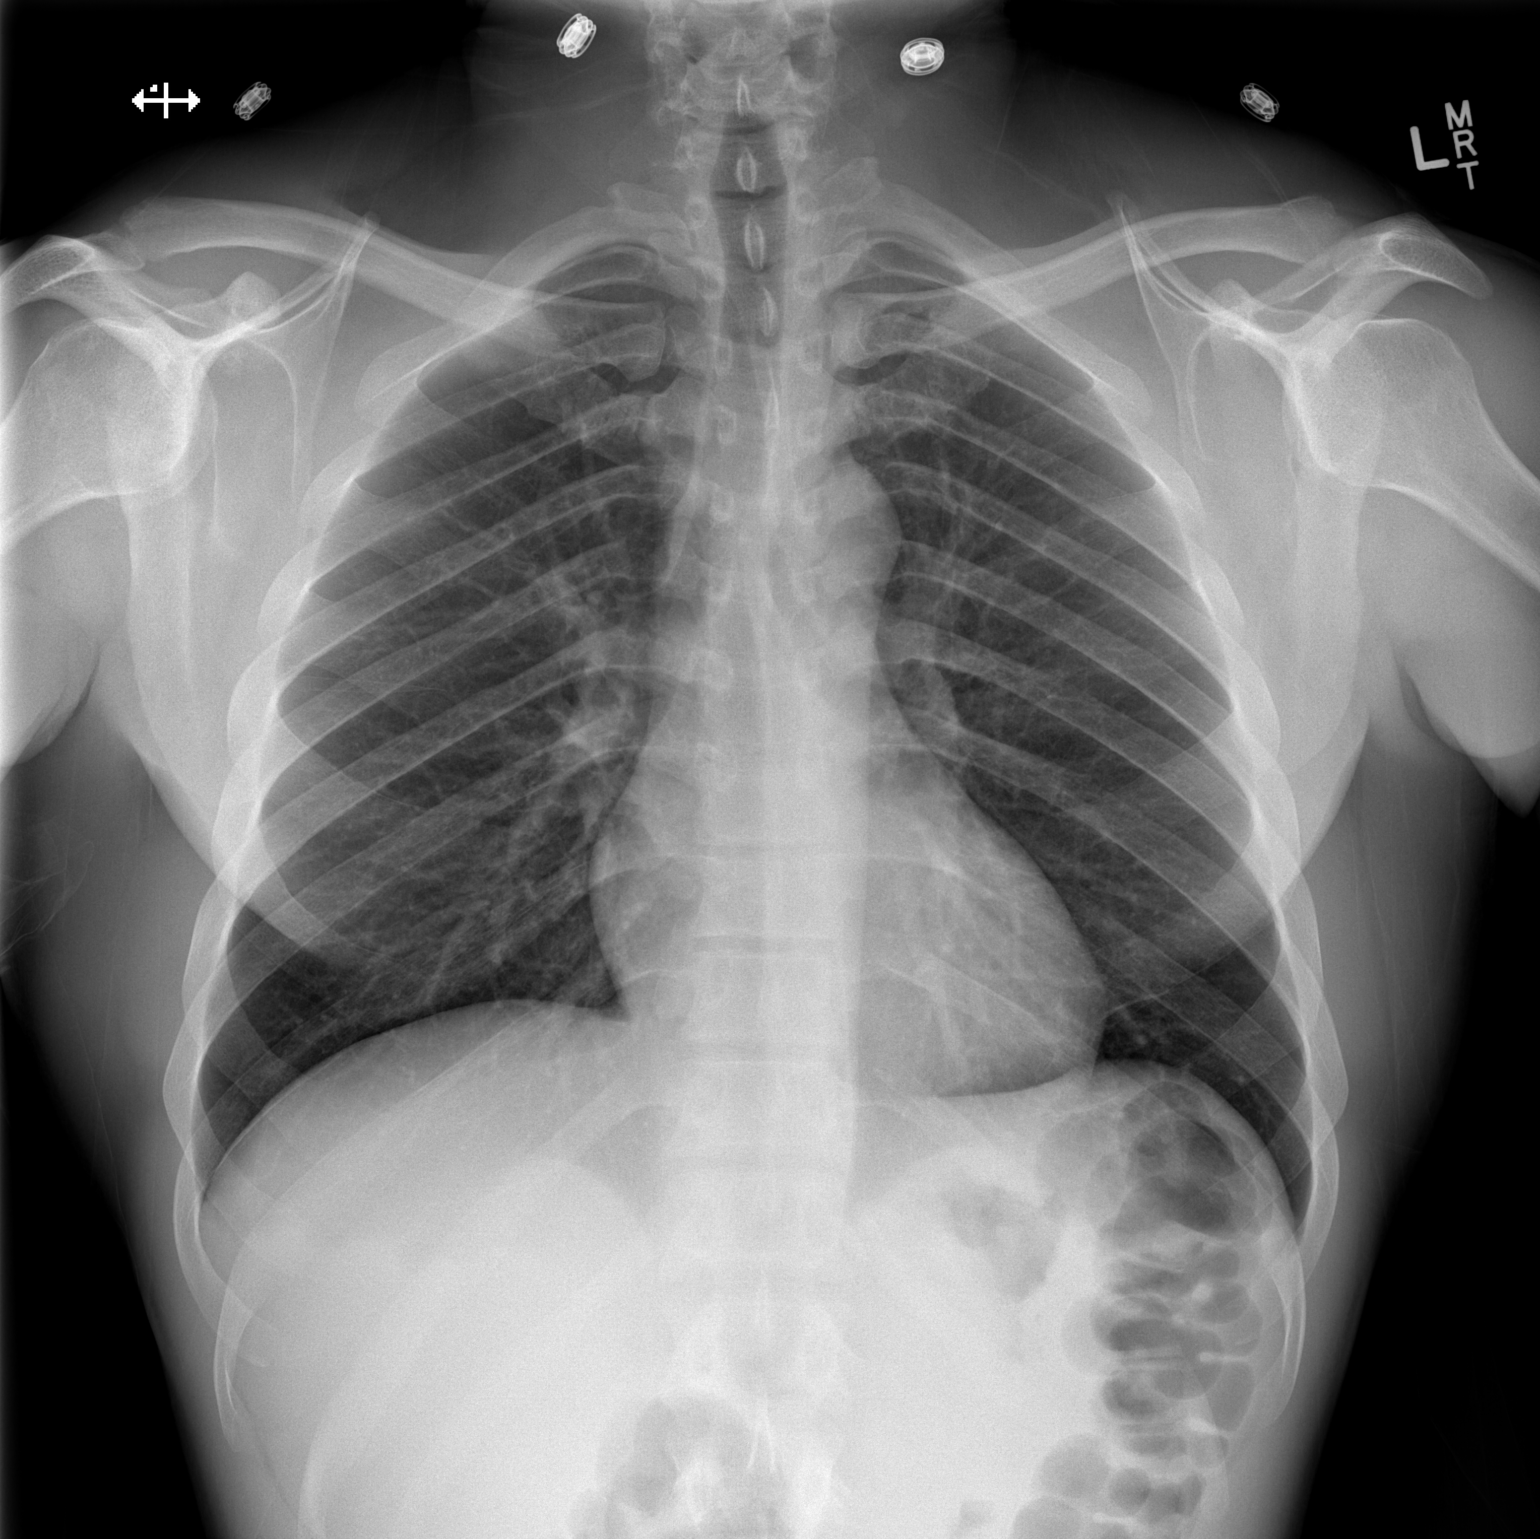

[w chest lat]
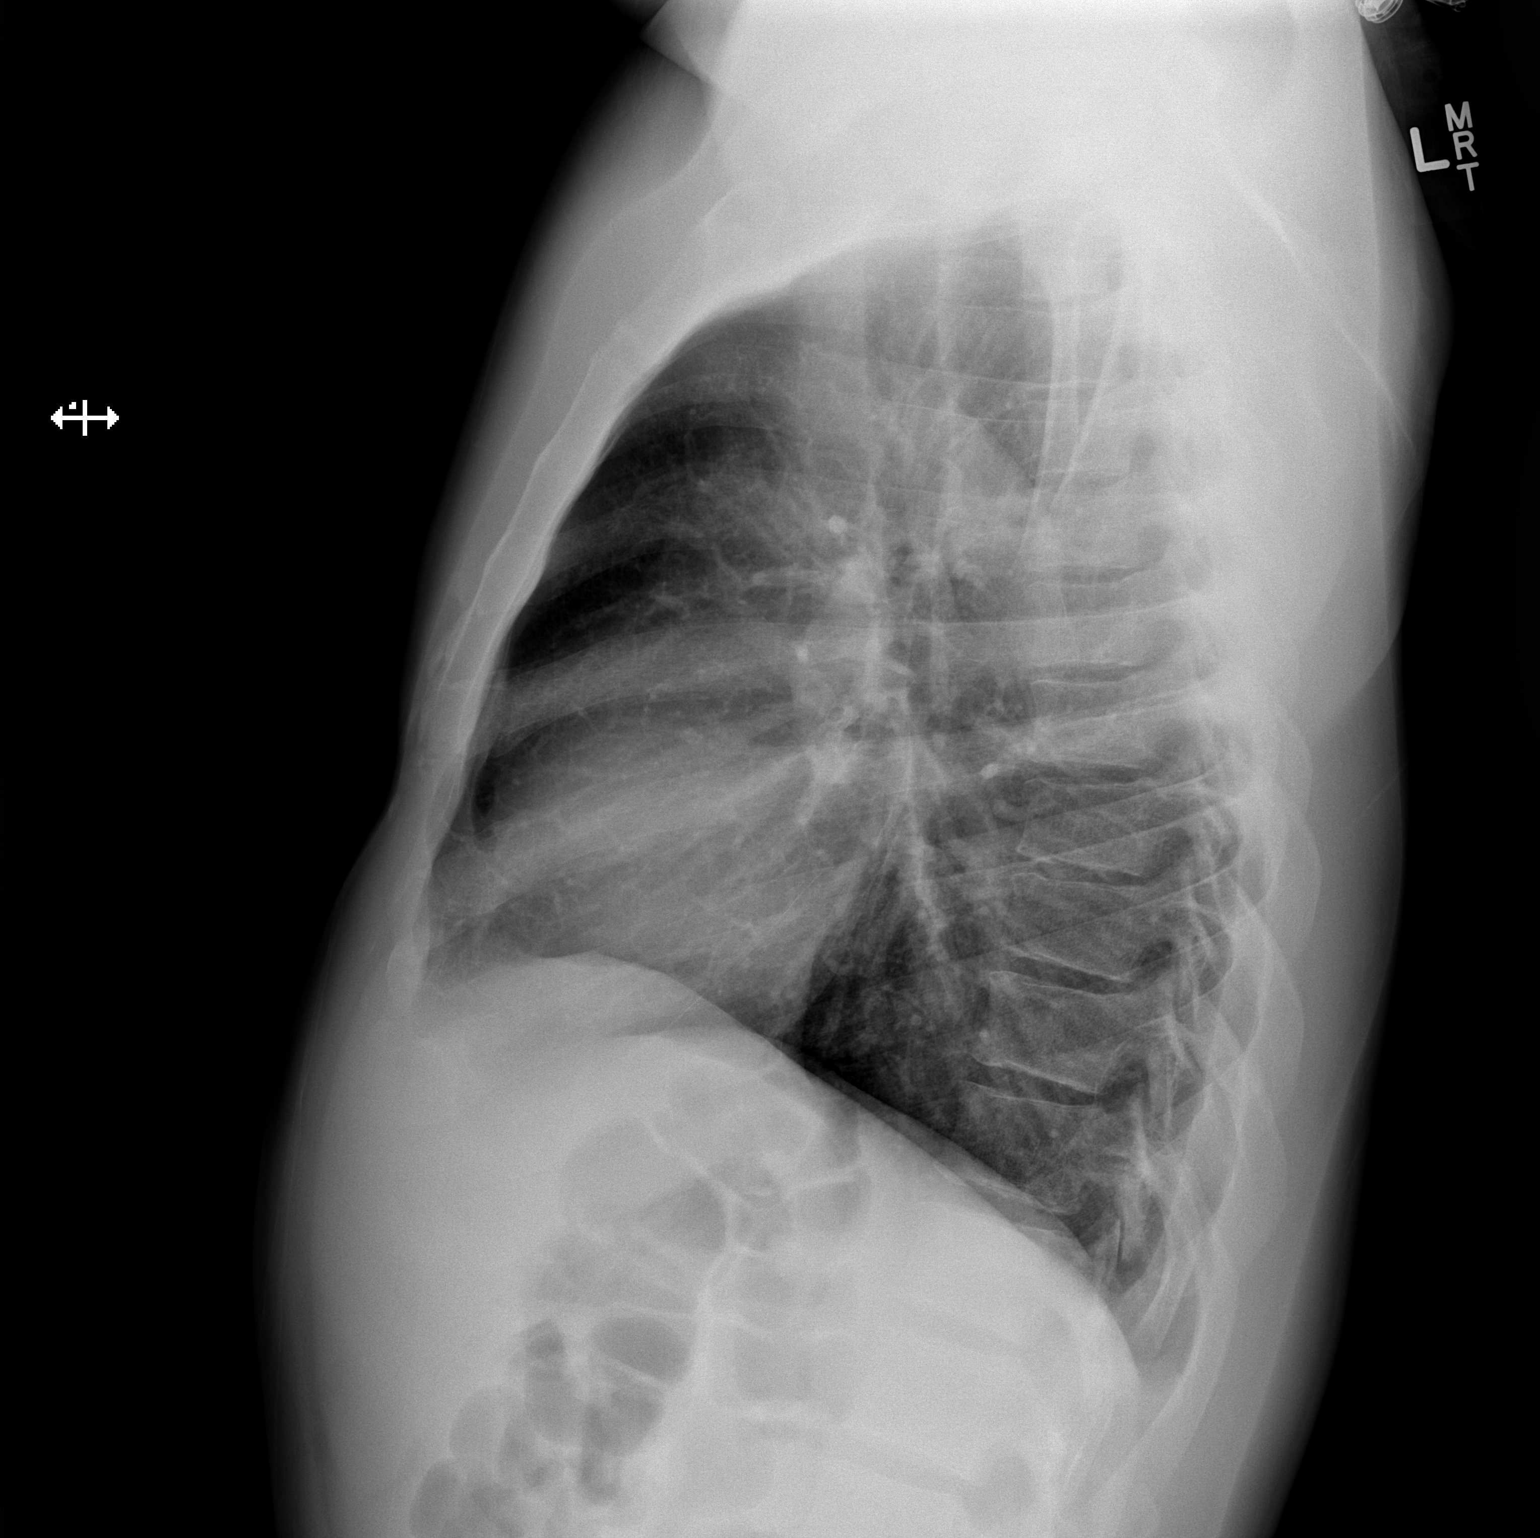

[2 of 2 positions shown; findings below may reference images not displayed]

FINDINGS: Cardiomediastinal silhouette is unremarkable. No acute infiltrate or
pleural effusion. No pulmonary edema. Bony thorax is unremarkable.
IMPRESSION: No active cardiopulmonary disease.
